# Patient Record
Sex: Female | Born: 1945 | Race: Black or African American | Hispanic: No | Marital: Single | Smoking: Current every day smoker
Health system: Southern US, Community
[De-identification: ages and names within clinical notes are randomized; demographics above are authoritative.]

## PROBLEM LIST (undated history)

## (undated) DIAGNOSIS — I639 Cerebral infarction, unspecified: Secondary | ICD-10-CM

## (undated) DIAGNOSIS — E119 Type 2 diabetes mellitus without complications: Secondary | ICD-10-CM

## (undated) DIAGNOSIS — I10 Essential (primary) hypertension: Secondary | ICD-10-CM

## (undated) DIAGNOSIS — F1410 Cocaine abuse, uncomplicated: Secondary | ICD-10-CM

## (undated) DIAGNOSIS — Z720 Tobacco use: Secondary | ICD-10-CM

## (undated) DIAGNOSIS — F1010 Alcohol abuse, uncomplicated: Secondary | ICD-10-CM

## (undated) HISTORY — PX: ABDOMINAL HYSTERECTOMY: SHX81

---

## 2017-10-02 ENCOUNTER — Encounter (HOSPITAL_COMMUNITY): Payer: Self-pay

## 2017-10-02 ENCOUNTER — Inpatient Hospital Stay (HOSPITAL_COMMUNITY): Payer: Medicare HMO

## 2017-10-02 ENCOUNTER — Emergency Department (HOSPITAL_COMMUNITY): Payer: Medicare HMO

## 2017-10-02 ENCOUNTER — Inpatient Hospital Stay (HOSPITAL_COMMUNITY)
Admission: EM | Admit: 2017-10-02 | Discharge: 2017-10-08 | DRG: 291 | Disposition: A | Discharge status: Skilled Nursing Facility | Payer: Medicare HMO | Attending: Oncology | Admitting: Oncology

## 2017-10-02 ENCOUNTER — Other Ambulatory Visit: Payer: Self-pay

## 2017-10-02 DIAGNOSIS — F39 Unspecified mood [affective] disorder: Secondary | ICD-10-CM | POA: Diagnosis not present

## 2017-10-02 DIAGNOSIS — Z79899 Other long term (current) drug therapy: Secondary | ICD-10-CM

## 2017-10-02 DIAGNOSIS — I5023 Acute on chronic systolic (congestive) heart failure: Secondary | ICD-10-CM | POA: Diagnosis present

## 2017-10-02 DIAGNOSIS — F1290 Cannabis use, unspecified, uncomplicated: Secondary | ICD-10-CM | POA: Diagnosis not present

## 2017-10-02 DIAGNOSIS — F319 Bipolar disorder, unspecified: Secondary | ICD-10-CM | POA: Diagnosis present

## 2017-10-02 DIAGNOSIS — Z9114 Patient's other noncompliance with medication regimen: Secondary | ICD-10-CM | POA: Diagnosis not present

## 2017-10-02 DIAGNOSIS — J9691 Respiratory failure, unspecified with hypoxia: Secondary | ICD-10-CM

## 2017-10-02 DIAGNOSIS — I509 Heart failure, unspecified: Secondary | ICD-10-CM

## 2017-10-02 DIAGNOSIS — R0600 Dyspnea, unspecified: Secondary | ICD-10-CM

## 2017-10-02 DIAGNOSIS — Z736 Limitation of activities due to disability: Secondary | ICD-10-CM | POA: Diagnosis not present

## 2017-10-02 DIAGNOSIS — F1210 Cannabis abuse, uncomplicated: Secondary | ICD-10-CM | POA: Diagnosis present

## 2017-10-02 DIAGNOSIS — F1410 Cocaine abuse, uncomplicated: Secondary | ICD-10-CM | POA: Diagnosis present

## 2017-10-02 DIAGNOSIS — I43 Cardiomyopathy in diseases classified elsewhere: Secondary | ICD-10-CM | POA: Diagnosis present

## 2017-10-02 DIAGNOSIS — E876 Hypokalemia: Secondary | ICD-10-CM | POA: Diagnosis not present

## 2017-10-02 DIAGNOSIS — F419 Anxiety disorder, unspecified: Secondary | ICD-10-CM

## 2017-10-02 DIAGNOSIS — Z91013 Allergy to seafood: Secondary | ICD-10-CM | POA: Diagnosis not present

## 2017-10-02 DIAGNOSIS — F1910 Other psychoactive substance abuse, uncomplicated: Secondary | ICD-10-CM

## 2017-10-02 DIAGNOSIS — I110 Hypertensive heart disease with heart failure: Secondary | ICD-10-CM | POA: Diagnosis not present

## 2017-10-02 DIAGNOSIS — R740 Nonspecific elevation of levels of transaminase and lactic acid dehydrogenase [LDH]: Secondary | ICD-10-CM | POA: Diagnosis present

## 2017-10-02 DIAGNOSIS — Z560 Unemployment, unspecified: Secondary | ICD-10-CM | POA: Diagnosis not present

## 2017-10-02 DIAGNOSIS — E1165 Type 2 diabetes mellitus with hyperglycemia: Secondary | ICD-10-CM

## 2017-10-02 DIAGNOSIS — R451 Restlessness and agitation: Secondary | ICD-10-CM | POA: Diagnosis not present

## 2017-10-02 DIAGNOSIS — Z8659 Personal history of other mental and behavioral disorders: Secondary | ICD-10-CM | POA: Diagnosis not present

## 2017-10-02 DIAGNOSIS — F1099 Alcohol use, unspecified with unspecified alcohol-induced disorder: Secondary | ICD-10-CM | POA: Diagnosis not present

## 2017-10-02 DIAGNOSIS — M6281 Muscle weakness (generalized): Secondary | ICD-10-CM | POA: Diagnosis not present

## 2017-10-02 DIAGNOSIS — E119 Type 2 diabetes mellitus without complications: Secondary | ICD-10-CM | POA: Diagnosis present

## 2017-10-02 DIAGNOSIS — N183 Chronic kidney disease, stage 3 (moderate): Secondary | ICD-10-CM | POA: Diagnosis present

## 2017-10-02 DIAGNOSIS — I130 Hypertensive heart and chronic kidney disease with heart failure and stage 1 through stage 4 chronic kidney disease, or unspecified chronic kidney disease: Secondary | ICD-10-CM | POA: Diagnosis present

## 2017-10-02 DIAGNOSIS — Z8673 Personal history of transient ischemic attack (TIA), and cerebral infarction without residual deficits: Secondary | ICD-10-CM

## 2017-10-02 DIAGNOSIS — F10239 Alcohol dependence with withdrawal, unspecified: Secondary | ICD-10-CM | POA: Diagnosis not present

## 2017-10-02 DIAGNOSIS — F1010 Alcohol abuse, uncomplicated: Secondary | ICD-10-CM

## 2017-10-02 DIAGNOSIS — Z23 Encounter for immunization: Secondary | ICD-10-CM

## 2017-10-02 DIAGNOSIS — F1490 Cocaine use, unspecified, uncomplicated: Secondary | ICD-10-CM | POA: Diagnosis not present

## 2017-10-02 DIAGNOSIS — F17210 Nicotine dependence, cigarettes, uncomplicated: Secondary | ICD-10-CM | POA: Diagnosis present

## 2017-10-02 DIAGNOSIS — I361 Nonrheumatic tricuspid (valve) insufficiency: Secondary | ICD-10-CM | POA: Diagnosis not present

## 2017-10-02 DIAGNOSIS — I878 Other specified disorders of veins: Secondary | ICD-10-CM | POA: Diagnosis not present

## 2017-10-02 DIAGNOSIS — E1122 Type 2 diabetes mellitus with diabetic chronic kidney disease: Secondary | ICD-10-CM | POA: Diagnosis present

## 2017-10-02 DIAGNOSIS — R450 Nervousness: Secondary | ICD-10-CM | POA: Diagnosis not present

## 2017-10-02 DIAGNOSIS — I160 Hypertensive urgency: Secondary | ICD-10-CM | POA: Diagnosis present

## 2017-10-02 DIAGNOSIS — IMO0002 Reserved for concepts with insufficient information to code with codable children: Secondary | ICD-10-CM

## 2017-10-02 DIAGNOSIS — I5021 Acute systolic (congestive) heart failure: Secondary | ICD-10-CM | POA: Diagnosis not present

## 2017-10-02 HISTORY — DX: Alcohol abuse, uncomplicated: F10.10

## 2017-10-02 HISTORY — DX: Cerebral infarction, unspecified: I63.9

## 2017-10-02 HISTORY — DX: Essential (primary) hypertension: I10

## 2017-10-02 HISTORY — DX: Tobacco use: Z72.0

## 2017-10-02 HISTORY — DX: Type 2 diabetes mellitus without complications: E11.9

## 2017-10-02 HISTORY — DX: Cocaine abuse, uncomplicated: F14.10

## 2017-10-02 LAB — COMPREHENSIVE METABOLIC PANEL
ALBUMIN: 3.5 g/dL (ref 3.5–5.0)
ALT: 40 U/L (ref 14–54)
ANION GAP: 9 (ref 5–15)
AST: 76 U/L — ABNORMAL HIGH (ref 15–41)
Alkaline Phosphatase: 91 U/L (ref 38–126)
BILIRUBIN TOTAL: 0.7 mg/dL (ref 0.3–1.2)
BUN: 22 mg/dL — ABNORMAL HIGH (ref 6–20)
CHLORIDE: 106 mmol/L (ref 101–111)
CO2: 23 mmol/L (ref 22–32)
Calcium: 8.3 mg/dL — ABNORMAL LOW (ref 8.9–10.3)
Creatinine, Ser: 1.37 mg/dL — ABNORMAL HIGH (ref 0.44–1.00)
GFR calc Af Amer: 43 mL/min — ABNORMAL LOW (ref >60)
GFR, EST NON AFRICAN AMERICAN: 37 mL/min — AB (ref >60)
GLUCOSE: 190 mg/dL — AB (ref 65–99)
POTASSIUM: 3.2 mmol/L — AB (ref 3.5–5.1)
Sodium: 138 mmol/L (ref 135–145)
TOTAL PROTEIN: 7.6 g/dL (ref 6.5–8.1)

## 2017-10-02 LAB — URINALYSIS, ROUTINE W REFLEX MICROSCOPIC
Bilirubin Urine: NEGATIVE
Glucose, UA: NEGATIVE mg/dL
Ketones, ur: NEGATIVE mg/dL
Nitrite: NEGATIVE
PROTEIN: 30 mg/dL — AB
Specific Gravity, Urine: 1.006 (ref 1.005–1.030)
pH: 5 (ref 5.0–8.0)

## 2017-10-02 LAB — MRSA PCR SCREENING: MRSA by PCR: NEGATIVE

## 2017-10-02 LAB — CBC WITH DIFFERENTIAL/PLATELET
BASOS PCT: 0 %
Basophils Absolute: 0 10*3/uL (ref 0.0–0.1)
EOS ABS: 0.1 10*3/uL (ref 0.0–0.7)
Eosinophils Relative: 1 %
HCT: 37.4 % (ref 36.0–46.0)
HEMOGLOBIN: 12.1 g/dL (ref 12.0–15.0)
Lymphocytes Relative: 22 %
Lymphs Abs: 2.3 10*3/uL (ref 0.7–4.0)
MCH: 31.3 pg (ref 26.0–34.0)
MCHC: 32.4 g/dL (ref 30.0–36.0)
MCV: 96.9 fL (ref 78.0–100.0)
Monocytes Absolute: 0.4 10*3/uL (ref 0.1–1.0)
Monocytes Relative: 4 %
NEUTROS ABS: 7.6 10*3/uL (ref 1.7–7.7)
NEUTROS PCT: 73 %
Platelets: 151 10*3/uL (ref 150–400)
RBC: 3.86 MIL/uL — AB (ref 3.87–5.11)
RDW: 14.1 % (ref 11.5–15.5)
WBC: 10.5 10*3/uL (ref 4.0–10.5)

## 2017-10-02 LAB — RAPID URINE DRUG SCREEN, HOSP PERFORMED
Amphetamines: NOT DETECTED
BENZODIAZEPINES: NOT DETECTED
Barbiturates: NOT DETECTED
COCAINE: NOT DETECTED
Opiates: POSITIVE — AB
Tetrahydrocannabinol: NOT DETECTED

## 2017-10-02 LAB — MAGNESIUM: Magnesium: 2 mg/dL (ref 1.7–2.4)

## 2017-10-02 LAB — PHOSPHORUS: Phosphorus: 4.5 mg/dL (ref 2.5–4.6)

## 2017-10-02 LAB — GLUCOSE, CAPILLARY
GLUCOSE-CAPILLARY: 125 mg/dL — AB (ref 65–99)
GLUCOSE-CAPILLARY: 145 mg/dL — AB (ref 65–99)

## 2017-10-02 LAB — ECHOCARDIOGRAM COMPLETE
HEIGHTINCHES: 61 in
WEIGHTICAEL: 2720 [oz_av]

## 2017-10-02 LAB — HEMOGLOBIN A1C
Hgb A1c MFr Bld: 4.9 % (ref 4.8–5.6)
MEAN PLASMA GLUCOSE: 93.93 mg/dL

## 2017-10-02 LAB — CBG MONITORING, ED: Glucose-Capillary: 308 mg/dL — ABNORMAL HIGH (ref 65–99)

## 2017-10-02 LAB — BRAIN NATRIURETIC PEPTIDE: B Natriuretic Peptide: 1730.2 pg/mL — ABNORMAL HIGH (ref 0.0–100.0)

## 2017-10-02 LAB — I-STAT TROPONIN, ED: TROPONIN I, POC: 0 ng/mL (ref 0.00–0.08)

## 2017-10-02 LAB — ETHANOL

## 2017-10-02 LAB — TSH: TSH: 0.976 u[IU]/mL (ref 0.350–4.500)

## 2017-10-02 MED ORDER — ASPIRIN EC 81 MG PO TBEC
81.0000 mg | DELAYED_RELEASE_TABLET | Freq: Every day | ORAL | Status: DC
  Administered 2017-10-02 – 2017-10-08 (×7): 81 mg via ORAL
  Filled 2017-10-02 (×7): qty 1

## 2017-10-02 MED ORDER — MORPHINE SULFATE (PF) 4 MG/ML IV SOLN
4.0000 mg | Freq: Once | INTRAVENOUS | Status: AC
  Administered 2017-10-02: 4 mg via INTRAVENOUS
  Filled 2017-10-02: qty 1

## 2017-10-02 MED ORDER — METHYLPREDNISOLONE SODIUM SUCC 125 MG IJ SOLR
125.0000 mg | Freq: Once | INTRAMUSCULAR | Status: AC
  Administered 2017-10-02: 125 mg via INTRAVENOUS
  Filled 2017-10-02: qty 2

## 2017-10-02 MED ORDER — SODIUM CHLORIDE 0.9% FLUSH: 3.0000 mL | INTRAVENOUS | Status: DC | PRN

## 2017-10-02 MED ORDER — FUROSEMIDE 10 MG/ML IJ SOLN
40.0000 mg | Freq: Every day | INTRAMUSCULAR | Status: DC
  Administered 2017-10-03 – 2017-10-04 (×2): 40 mg via INTRAVENOUS
  Filled 2017-10-02 (×2): qty 4

## 2017-10-02 MED ORDER — ADULT MULTIVITAMIN W/MINERALS CH
1.0000 | ORAL_TABLET | Freq: Every day | ORAL | Status: DC
  Administered 2017-10-02 – 2017-10-08 (×7): 1 via ORAL
  Filled 2017-10-02 (×7): qty 1

## 2017-10-02 MED ORDER — LORAZEPAM 2 MG/ML IJ SOLN
2.0000 mg | INTRAMUSCULAR | Status: DC | PRN
  Administered 2017-10-02 – 2017-10-04 (×4): 2 mg via INTRAVENOUS
  Filled 2017-10-02 (×4): qty 1

## 2017-10-02 MED ORDER — LORAZEPAM 2 MG/ML IJ SOLN
1.0000 mg | Freq: Once | INTRAMUSCULAR | Status: AC
  Administered 2017-10-02: 1 mg via INTRAVENOUS
  Filled 2017-10-02: qty 1

## 2017-10-02 MED ORDER — SODIUM CHLORIDE 0.9% FLUSH
3.0000 mL | Freq: Two times a day (BID) | INTRAVENOUS | Status: DC
  Administered 2017-10-02 – 2017-10-06 (×7): 3 mL via INTRAVENOUS

## 2017-10-02 MED ORDER — VITAMIN B-1 100 MG PO TABS
100.0000 mg | ORAL_TABLET | Freq: Every day | ORAL | Status: DC
  Administered 2017-10-02 – 2017-10-08 (×7): 100 mg via ORAL
  Filled 2017-10-02 (×7): qty 1

## 2017-10-02 MED ORDER — PNEUMOCOCCAL VAC POLYVALENT 25 MCG/0.5ML IJ INJ
0.5000 mL | INJECTION | INTRAMUSCULAR | Status: AC
  Administered 2017-10-04: 0.5 mL via INTRAMUSCULAR
  Filled 2017-10-02: qty 0.5

## 2017-10-02 MED ORDER — HYDRALAZINE HCL 20 MG/ML IJ SOLN
5.0000 mg | Freq: Four times a day (QID) | INTRAMUSCULAR | Status: DC | PRN
  Administered 2017-10-02 – 2017-10-03 (×4): 5 mg via INTRAVENOUS
  Filled 2017-10-02 (×4): qty 1

## 2017-10-02 MED ORDER — NICOTINE 14 MG/24HR TD PT24
14.0000 mg | MEDICATED_PATCH | Freq: Every day | TRANSDERMAL | Status: DC
  Administered 2017-10-02 – 2017-10-08 (×7): 14 mg via TRANSDERMAL
  Filled 2017-10-02 (×7): qty 1

## 2017-10-02 MED ORDER — INFLUENZA VAC SPLIT HIGH-DOSE 0.5 ML IM SUSY
0.5000 mL | PREFILLED_SYRINGE | INTRAMUSCULAR | Status: AC
  Administered 2017-10-05: 0.5 mL via INTRAMUSCULAR
  Filled 2017-10-02: qty 0.5

## 2017-10-02 MED ORDER — NITROGLYCERIN IN D5W 200-5 MCG/ML-% IV SOLN
0.0000 ug/min | Freq: Once | INTRAVENOUS | Status: AC
  Administered 2017-10-02: 5 ug/min via INTRAVENOUS
  Filled 2017-10-02: qty 250

## 2017-10-02 MED ORDER — ALBUTEROL SULFATE (2.5 MG/3ML) 0.083% IN NEBU
10.0000 mg | INHALATION_SOLUTION | Freq: Once | RESPIRATORY_TRACT | Status: AC
  Administered 2017-10-02: 10 mg via RESPIRATORY_TRACT
  Filled 2017-10-02: qty 12

## 2017-10-02 MED ORDER — ACETAMINOPHEN 325 MG PO TABS
650.0000 mg | ORAL_TABLET | Freq: Four times a day (QID) | ORAL | Status: DC | PRN
  Administered 2017-10-02 – 2017-10-03 (×2): 650 mg via ORAL
  Filled 2017-10-02 (×2): qty 2

## 2017-10-02 MED ORDER — ALPRAZOLAM 0.5 MG PO TABS
0.5000 mg | ORAL_TABLET | Freq: Two times a day (BID) | ORAL | Status: DC | PRN
  Administered 2017-10-02 – 2017-10-04 (×4): 0.5 mg via ORAL
  Filled 2017-10-02 (×4): qty 1

## 2017-10-02 MED ORDER — ACETAMINOPHEN 650 MG RE SUPP: 650.0000 mg | Freq: Four times a day (QID) | RECTAL | Status: DC | PRN

## 2017-10-02 MED ORDER — INSULIN ASPART 100 UNIT/ML ~~LOC~~ SOLN
0.0000 [IU] | Freq: Three times a day (TID) | SUBCUTANEOUS | Status: DC
  Administered 2017-10-02: 1 [IU] via SUBCUTANEOUS
  Administered 2017-10-02: 7 [IU] via SUBCUTANEOUS
  Administered 2017-10-04: 3 [IU] via SUBCUTANEOUS
  Administered 2017-10-05 – 2017-10-07 (×2): 2 [IU] via SUBCUTANEOUS
  Administered 2017-10-08: 1 [IU] via SUBCUTANEOUS
  Filled 2017-10-02: qty 1

## 2017-10-02 MED ORDER — HEPARIN SODIUM (PORCINE) 5000 UNIT/ML IJ SOLN
5000.0000 [IU] | Freq: Three times a day (TID) | INTRAMUSCULAR | Status: DC
  Administered 2017-10-02 – 2017-10-08 (×13): 5000 [IU] via SUBCUTANEOUS
  Filled 2017-10-02 (×13): qty 1

## 2017-10-02 MED ORDER — SODIUM CHLORIDE 0.9 % IV SOLN: 250.0000 mL | INTRAVENOUS | Status: DC | PRN

## 2017-10-02 MED ORDER — ALBUTEROL SULFATE (2.5 MG/3ML) 0.083% IN NEBU: 2.5000 mg | INHALATION_SOLUTION | RESPIRATORY_TRACT | Status: DC | PRN

## 2017-10-02 MED ORDER — FUROSEMIDE 10 MG/ML IJ SOLN
40.0000 mg | Freq: Once | INTRAMUSCULAR | Status: AC
  Administered 2017-10-02: 40 mg via INTRAVENOUS
  Filled 2017-10-02: qty 4

## 2017-10-02 MED ORDER — FOLIC ACID 5 MG/ML IJ SOLN
1.0000 mg | Freq: Every day | INTRAMUSCULAR | Status: DC
  Administered 2017-10-02 – 2017-10-03 (×2): 1 mg via INTRAVENOUS
  Filled 2017-10-02 (×2): qty 0.2

## 2017-10-02 MED ORDER — POTASSIUM CHLORIDE CRYS ER 20 MEQ PO TBCR
40.0000 meq | EXTENDED_RELEASE_TABLET | Freq: Once | ORAL | Status: AC
  Administered 2017-10-02: 40 meq via ORAL
  Filled 2017-10-02: qty 2

## 2017-10-02 MED ORDER — SODIUM CHLORIDE 0.9% FLUSH
3.0000 mL | Freq: Two times a day (BID) | INTRAVENOUS | Status: DC
  Administered 2017-10-02 – 2017-10-07 (×9): 3 mL via INTRAVENOUS

## 2017-10-02 NOTE — ED Provider Notes (Signed)
[facility] EMERGENCY DEPARTMENT Provider Note   CSN: [id number] Arrival date & time: 10/02/17  0345     History   Chief Complaint Chief Complaint  Patient presents with  . Shortness of Breath  . Altered Mental Status    HPI Zandria Woldt is a 72 y.o. female.  Patient is a 72 year old female with past medical history of COPD.  She was brought by EMS for evaluation of shortness of breath.  Apparently she woke this morning with difficulty breathing.  She called EMS and was transported here.  She has been extremely anxious and tachypneic during transport.  Her oxygen saturations were in the 80s.  She is somewhat difficult to get a history from, however she denies any chest pain, recent fever, or productive cough.   The history is provided by the patient and the EMS personnel.  Shortness of Breath  This is a new problem. The average episode lasts 1 hour. The problem occurs continuously.The problem has not changed since onset.Pertinent negatives include no fever, no cough, no sputum production and no chest pain.  Altered Mental Status      No past medical history on file.  There are no active problems to display for this patient.    OB History    No data available       Home Medications    Prior to Admission medications   Not on File    Family History No family history on file.  Social History Social History   Tobacco Use  . Smoking status: Not on file  Substance Use Topics  . Alcohol use: Not on file  . Drug use: Not on file     Allergies   Patient has no allergy information on record.   Review of Systems Review of Systems  Constitutional: Negative for fever.  Respiratory: Positive for shortness of breath. Negative for cough and sputum production.   Cardiovascular: Negative for chest pain.  All other systems reviewed and are negative.    Physical Exam Updated Vital Signs BP (!) 189/129 (BP Location: Right Arm)   Pulse (!) 118    Temp (!) 96.2 F (35.7 C) (Temporal)   Resp (!) 35   SpO2 (!) 82%   Physical Exam  Constitutional: She is oriented to person, place, and time. She appears well-developed and well-nourished. No distress.  Patient is very anxious and hyperventilating.  HENT:  Head: Normocephalic and atraumatic.  Neck: Normal range of motion. Neck supple.  Cardiovascular: Normal rate and regular rhythm. Exam reveals no gallop and no friction rub.  No murmur heard. Pulmonary/Chest: Effort normal and breath sounds normal. No respiratory distress. She has no wheezes. She has no rales.  Patient with expiratory wheezes bilaterally.  Abdominal: Soft. Bowel sounds are normal. She exhibits no distension. There is no tenderness.  Musculoskeletal: Normal range of motion.       Right lower leg: She exhibits no tenderness and no edema.       Left lower leg: She exhibits no tenderness and no edema.  Neurological: She is alert and oriented to person, place, and time.  Skin: Skin is warm and dry. She is not diaphoretic.  Nursing note and vitals reviewed.    ED Treatments / Results  Labs (all labs ordered are listed, but only abnormal results are displayed) Labs Reviewed  COMPREHENSIVE METABOLIC PANEL  ETHANOL  BRAIN NATRIURETIC PEPTIDE  CBC WITH DIFFERENTIAL/PLATELET  I-STAT TROPONIN, ED    EKG  EKG Interpretation None  Radiology No results found.  Procedures Procedures (including critical care time)  Medications Ordered in ED Medications  albuterol (PROVENTIL) (2.5 MG/3ML) 0.083% nebulizer solution 10 mg (not administered)  methylPREDNISolone sodium succinate (SOLU-MEDROL) 125 mg/2 mL injection 125 mg (not administered)     Initial Impression / Assessment and Plan / ED Course  I have reviewed the triage vital signs and the nursing notes.  Pertinent labs & imaging results that were available during my care of the patient were reviewed by me and considered in my medical decision making  (see chart for details).  Patient presents here with complaints of difficulty breathing.  This woke her from sleep.  Her chest x-ray is consistent with congestive heart failure and BNP is 1700.  She has no history of this.  Is also markedly hypertensive and hypoxic.  She required nitroglycerin intravenously and supplemental oxygen.  Her blood pressure has improved somewhat and she is feeling somewhat better.  As she has no history of CHF, I feel as though admission for further workup is indicated.  I have spoken with the internal medicine teaching service who agrees to admit.  CRITICAL CARE Performed by: Geoffery Lyons Total critical care time: 45 minutes Critical care time was exclusive of separately billable procedures and treating other patients. Critical care was necessary to treat or prevent imminent or life-threatening deterioration. Critical care was time spent personally by me on the following activities: development of treatment plan with patient and/or surrogate as well as nursing, discussions with consultants, evaluation of patient's response to treatment, examination of patient, obtaining history from patient or surrogate, ordering and performing treatments and interventions, ordering and review of laboratory studies, ordering and review of radiographic studies, pulse oximetry and re-evaluation of patient's condition.   Final Clinical Impressions(s) / ED Diagnoses   Final diagnoses:  None    ED Discharge Orders    None       Geoffery Lyons, MD 10/02/17 [phone]

## 2017-10-02 NOTE — ED Notes (Signed)
[phone] [name] Chuiso Pt daughter

## 2017-10-02 NOTE — ED Notes (Signed)
Pt yelling out, found at end of bed grabbing legs asking for help with leg pain, stated "does alcohol help with the pain?!". Pt informed alcohol would not help and asked to sit back in bed. Pt repositioned and nasal cannula replaced. Primary nurse aware.

## 2017-10-02 NOTE — ED Triage Notes (Signed)
Pt BIB [facility] for eval of SHOB. Pt coming from home for SOB, pt arrives extremely anxious, tachypneic and  Hypertensive. EMS reports pt +ETOH- vodka drinks.

## 2017-10-02 NOTE — ED Notes (Signed)
Admitting MD at bedside.

## 2017-10-02 NOTE — H&P (Addendum)
Date: 10/02/2017               Patient Name:  Taylor Carrillo MRN: [id number]  DOB: 10/07/1945 Age / Sex: 72 y.o., female   PCP: System, Pcp Not In         Medical Service: Internal Medicine Teaching Service         Attending Physician: Dr. Beryle Beams, Alyson Locket, MD    First Contact: Dr. Aggie Hacker Pager: [phone]  Second Contact: Dr. Danford Bad Pager: [phone]       After Hours (After 5p/  First Contact Pager: [phone]  weekends / holidays): Second Contact Pager: [phone]   Chief Complaint: Shortness of breath  History of Present Illness:  Taylor Carrillo is a 72 year old female with a past medical history significant for type 2 diabetes mellitus, hypertension, prior CVA, anxiety, cocaine/tobacco/marijuana/alcohol abuse who presented to the emergency department this morning with complaints of shortness of breath.  She reports that she was awakened from sleep this morning with difficulty breathing and called EMS.  She reports that she has had ongoing shortness of breath for the past 1-2 months.  She says that she has had stable 2 pillow orthopnea for the past several months, however, in the last 1-2 months she has had no new shortness of breath while lying flat.  Additionally, she notes that she has had several episodes where she will wake up at night gasping for air as she had this morning.  She denies any dyspnea with exertion, lower extremity swelling, cough, chest pain, palpitations, fever.  He does report a history of active cocaine abuse and cocaine as often as she is able to obtain it, usually weekly.  She says that last used cocaine about a month ago.  She reports that sometime last year she discontinued all of her blood pressure and diabetic medications as she felt it was not helping her.  Shortly, she notes a recent hospitalization at an outside hospital after having a possible syncopal episode.  She is unable to recall the name of this hospital or what her final diagnosis from that hospitalization  although she notes it may have been related to a prior stroke.  In the ED she was noted to be afebrile, tachycardic (100-118), tachypneic (35), significantly hypertensive with a blood pressure of 237/142, and hypoxic to 82% on room air that improved when placed on 6 L of oxygen.  She was noted to be very anxious and hyper ventilating with expiratory wheezes bilaterally by the ED provider.  Her chest x-ray in the emergency room was notable for cardiomegaly with pulmonary edema and bilateral pleural effusions.  Her BNP returned at 1700.  EKG was only notable for sinus tachycardia with LVH and initial troponin was 0.00.  Received an albuterol nebulizer treatment, Solu-Medrol, Lasix, nitroglycerin drip in the ED.  Her respiratory status improved significantly after treatment.  Internal medicine teaching service was called to admit for further workup and evaluation.  Meds:  Current Meds  Medication Sig  . ALPRAZolam (XANAX) 0.5 MG tablet Take 0.5 mg by mouth 2 (two) times daily as needed for anxiety.     Allergies: Allergies as of 10/02/2017 - Review Complete 10/02/2017  Allergen Reaction Noted  . Shrimp [shellfish allergy] Swelling 10/02/2017   Past Medical History:  Diagnosis Date  . Alcohol abuse   . Cocaine abuse ([place])   . CVA (cerebral vascular accident) ([place])   . Diabetes mellitus without complication ([place])   . Hypertension   . Tobacco  abuse    Past Surgical History:  Procedure Laterality Date  . ABDOMINAL HYSTERECTOMY     Family History: Family History  Problem Relation Age of Onset  . Heart failure Sister   . Diabetes Daughter    Social History:  Social History   Tobacco Use  . Smoking status: Current Every Day Smoker    Packs/day: 2.00    Years: 50.00    Pack years: 100.00    Types: Cigarettes  . Smokeless tobacco: Never Used  Substance Use Topics  . Alcohol use: Yes    Alcohol/week: 8.4 oz    Types: 14 Standard drinks or equivalent per week    Comment: 2-3 shots  of liquor daily  . Drug use: Yes    Types: Marijuana, Cocaine    Comment: TODAY    Review of Systems: A complete ROS was negative except as per HPI.   Physical Exam: Blood pressure (!) 182/108, pulse 90, temperature (!) 96.2 F (35.7 C), temperature source Temporal, resp. rate 19, height '5\' 1"'  (1.549 m), weight 170 lb (77.1 kg), SpO2 95 %. General: alert, well-developed, and cooperative to examination.  Head: normocephalic and atraumatic.  Eyes: vision grossly intact, pupils equal, pupils round, pupils reactive to light, no injection and anicteric.  Mouth: pharynx pink and moist, no erythema, and no exudates.  Neck: supple, full ROM, no thyromegaly, no JVD, positive hepatojugular reflux, and no carotid bruits.  Lungs: normal respiratory effort, no accessory muscle use, diminished breath sounds at bilateral lung bases, mild expiratory wheezing diffusely.  No crackles appreciated. Heart: Tachycardic, regular rhythm, no murmur, no gallop, and no rub.  Abdomen: soft, non-tender, normal bowel sounds, no distention, no guarding Msk: no joint swelling, no joint warmth, and no redness over joints.  Pulses: 1+ DP/PT pulses bilaterally Extremities: No cyanosis, clubbing, edema Neurologic: alert & oriented X3, cranial nerves II-XII intact, strength normal in all extremities, sensation intact to light touch Skin: turgor normal and no rashes.  Psych: Normal mood and affect   EKG: personally reviewed my interpretation is sinus tachycardia with LVH, no priors to compare  CXR: personally reviewed my interpretation is cardiomegaly with pulmonary edema and bilateral pleural effusions  Assessment & Plan by Problem:  Hypoxic respiratory failure with hypertensive urgency and signs of volume overload: Patient's initial blood pressure on arrival was 237/145 with respiratory distress and hypoxia.  No evidence of endorgan damage on blood work with negative troponin and reassuring CMP only notable for  mildly elevated creatinine though unclear baseline.  She was initially treated with nitroglycerin drip in the ED with good response and improvement in symptoms.  Work did reveal elevated BNP of 1700.  Chest x-ray did demonstrate bilateral pulmonary effusions.  EKG was remarkable for sinus tachycardia and LVH.  Patient reports a history of hypertension however has self discontinued her medications for unknown duration.  Additionally, she has a history of cocaine abuse reports most recent use of approximately 1 month ago.  She reports ongoing two-pillow orthopnea and recent PND for the past 1-2 months.  Overall, her picture is most consistent with uncontrolled hypertension with possible underlying heart failure.  Discontinue her nitroglycerin drip and use hydralazine as needed to maintain systolic blood pressure less than 180.  Will initiate oral antihypertensives once we have a more clear picture possible heart failure and her renal function.  Will obtain echocardiogram to assess for possible underlying heart failure.  She received a dose of IV Lasix 40 mg in the ED.  Will  obtain daily weights and monitor intake and output and continue at this daily dose of Lasix and assess response.  Given her new possible heart failure and ongoing cocaine abuse we will avoid beta-blockers at this time.  Repeat BMP in the morning.  Diabetes mellitus type 2: Patient reports a history of diabetes.  She notes that she was previously on medications including Jardiance however self discontinued sometime last year.  Does not appear to have ever been on insulin.  Blood glucose on initial presentation today is 190.  Will check hemoglobin A1c and start sliding scale insulin sensitive.  AKI versus CKD stage IIIb: Creatinine 1.37 with a GFR 43 on presentation.  No baseline labs to compare.  Given her apparent long-standing uncontrolled hypertension, diabetes, and ongoing cocaine abuse she most likely has some degree of chronic kidney  disease.  Will obtain UA today and repeat BMP in the morning.  Hypokalemia: Potassium 3.2 on arrival.  Will replace with K-Dur 40 mEq today.  Check mag and phosphorus levels.  Elevated transaminases: AST 76 with ALT 40.  Normal alk phos and bilirubin.  Given her long-standing alcohol abuse this most likely well represents sedation secondary to alcohol.  Will monitor.  History of prior CVA: She reports a remote history of CVA though has no long-standing deficits.  Will start her on aspirin today.  Check fasting lipid panel in the morning and A1c.  Anxiety: She reports taking Xanax twice a day for anxiety.  Review of the [facility] does show Xanax 0.5 mg #54 filled monthly.  Will continue Xanax 0.5 mg twice daily as needed.  Cocaine and marijuana abuse: Check UDS  Alcohol abuse: Reports 2-3 shots of liquor daily.  Denies any history of alcohol withdrawal or DTs.  Does note remote history of possible seizure though is unclear if related to alcohol withdrawal.  Will start CIWA protocol.  Thiamine, folate, multivitamin daily.  Tobacco abuse: Reports smoking 2 cigarettes a day though long roughly 100-pack-year history of smoking.  Provide cessation education.  DVT PPx: Heparin SQ  Dispo: Admit patient to Inpatient with expected length of stay greater than 2 midnights.  Signed: Maryellen Pile, MD 10/02/2017, 8:56 AM  Pager: [phone]

## 2017-10-02 NOTE — Progress Notes (Signed)
  Echocardiogram 2D Echocardiogram has been performed.  Taylor Carrillo [name] 10/02/2017, 2:48 PM

## 2017-10-03 DIAGNOSIS — F10239 Alcohol dependence with withdrawal, unspecified: Secondary | ICD-10-CM

## 2017-10-03 DIAGNOSIS — F1490 Cocaine use, unspecified, uncomplicated: Secondary | ICD-10-CM

## 2017-10-03 DIAGNOSIS — Z9114 Patient's other noncompliance with medication regimen: Secondary | ICD-10-CM

## 2017-10-03 DIAGNOSIS — F419 Anxiety disorder, unspecified: Secondary | ICD-10-CM

## 2017-10-03 DIAGNOSIS — Z8659 Personal history of other mental and behavioral disorders: Secondary | ICD-10-CM

## 2017-10-03 DIAGNOSIS — Z79899 Other long term (current) drug therapy: Secondary | ICD-10-CM

## 2017-10-03 DIAGNOSIS — I110 Hypertensive heart disease with heart failure: Secondary | ICD-10-CM

## 2017-10-03 DIAGNOSIS — I5021 Acute systolic (congestive) heart failure: Secondary | ICD-10-CM

## 2017-10-03 DIAGNOSIS — I43 Cardiomyopathy in diseases classified elsewhere: Secondary | ICD-10-CM

## 2017-10-03 LAB — TROPONIN I
TROPONIN I: 0.03 ng/mL — AB (ref <0.03)
TROPONIN I: 0.04 ng/mL — AB (ref <0.03)
Troponin I: 0.03 ng/mL (ref <0.03)

## 2017-10-03 LAB — GLUCOSE, CAPILLARY
GLUCOSE-CAPILLARY: 94 mg/dL (ref 65–99)
Glucose-Capillary: 105 mg/dL — ABNORMAL HIGH (ref 65–99)
Glucose-Capillary: 102 mg/dL — ABNORMAL HIGH (ref 65–99)
Glucose-Capillary: 94 mg/dL (ref 65–99)

## 2017-10-03 LAB — LIPID PANEL
CHOL/HDL RATIO: 3.1 ratio
Cholesterol: 245 mg/dL — ABNORMAL HIGH (ref 0–200)
HDL: 79 mg/dL (ref >40)
LDL Cholesterol: 152 mg/dL — ABNORMAL HIGH (ref 0–99)
Triglycerides: 71 mg/dL (ref <150)
VLDL: 14 mg/dL (ref 0–40)

## 2017-10-03 LAB — BASIC METABOLIC PANEL
Anion gap: 10 (ref 5–15)
BUN: 27 mg/dL — AB (ref 6–20)
CHLORIDE: 105 mmol/L (ref 101–111)
CO2: 22 mmol/L (ref 22–32)
CREATININE: 1.26 mg/dL — AB (ref 0.44–1.00)
Calcium: 8.7 mg/dL — ABNORMAL LOW (ref 8.9–10.3)
GFR calc Af Amer: 48 mL/min — ABNORMAL LOW (ref >60)
GFR calc non Af Amer: 41 mL/min — ABNORMAL LOW (ref >60)
GLUCOSE: 114 mg/dL — AB (ref 65–99)
POTASSIUM: 4.2 mmol/L (ref 3.5–5.1)
Sodium: 137 mmol/L (ref 135–145)

## 2017-10-03 MED ORDER — LISINOPRIL 5 MG PO TABS
5.0000 mg | ORAL_TABLET | Freq: Every day | ORAL | Status: DC
  Administered 2017-10-03 – 2017-10-04 (×2): 5 mg via ORAL
  Filled 2017-10-03 (×2): qty 1

## 2017-10-03 MED ORDER — FOLIC ACID 1 MG PO TABS
1.0000 mg | ORAL_TABLET | Freq: Every day | ORAL | Status: DC
  Administered 2017-10-04 – 2017-10-08 (×5): 1 mg via ORAL
  Filled 2017-10-03 (×5): qty 1

## 2017-10-03 NOTE — Progress Notes (Signed)
   Subjective:  Patient seen and examined. No acute events overnight. She is accompanied by her daughter this AM. States her breathing has improved and has only mild shortness of breath occasionally. May be experiencing withdrawal symptoms. States she felt like a bug was crawling in her ear last night and she has been hearing things that are not there since she has been hospitalized Discussed her diagnosis of heart failure with the patient and her daughter.   Per patient's daughter she has a long history of crack cocaine and substance use. The daughter recently moved her from [place] to attempt to remove her from the environment where she was able to obtain drugs easily. The daughter states she is overwhelmed and needs help. Daughter says she the patient has a history of bipolar disorder, and was seeing a psychiatrist in [place].    Objective:  Vital signs in last 24 hours: Vitals:   10/02/17 2208 10/03/17 0005 10/03/17 0500 10/03/17 0753  BP: (!) 181/109 (!) 161/106 (!) 176/101 (!) 184/106  Pulse: 79 80 82 88  Resp:  (!) 22 20   Temp:  97.6 F (36.4 C) 98.4 F (36.9 C) 98.2 F (36.8 C)  TempSrc:  Axillary Axillary Oral  SpO2: 97% 98% 99% 99%  Weight:   154 lb 8.7 oz (70.1 kg)   Height:       General: Laying in bed comfortably, NAD HEENT: [place]/AT, EOMI, no scleral icterus, PERRL Cardiac: RRR, No R/M/G appreciated Pulm: normal effort, decreased breath sounds at the bases Abd: soft, non tender, non distended, BS normal Ext: extremities well perfused, no peripheral edema Neuro: alert and oriented X3, cranial nerves II-XII grossly intact   Assessment/Plan:  Principal Problem:   Respiratory failure with hypoxia (HCC) Active Problems:   Hypertensive urgency   Dyspnea   Drug abuse (HCC)   Alcohol abuse   Diabetes type 2, uncontrolled (HCC)   Anxiety  Acute Systolic Heart Failure Exacerbation Patient presented with acute onset dyspnea, hypoxia, BNP of 1730, and CXR consistent  with pulmonary edema. ECHO with moderate concentric hypertrophy; systolic function was moderately reduced; estimated ejection fraction was in the range of 35% -40%. Moderate diffuse hypokinesis with no identifiable regional variations. Euvolemic on examination, no JVD or peripheral edema. Lung exam with decreased breath sounds at bases bilaterally, but improved; saturating 99% on RA. Weight on admission 155.  -Will continue IV lasix 40 mg daily, will monitor creatine and lytes  -Strict I/Os -Daily weights -BMET in AM  Hypertension Patient has been non-adherent with blood pressure medications for quite sometime. Blood pressures on arrival to ED up to 230 systolic. Have improved 170-180 systolic, 90-100 diastolic. Creatinine 1.26 today from 1.37. No baseline available.  -Continue to monitor vitals -Continue Hydral 5 mg q6 PRN for SBP >180   Anxiety, ?Bipolar Disorder Polysubstance Use Currently on CIWA with ativan. Required two doses of ativan yesterday, CIWA scores 15,10.    -Continue 0.5 mg of ativan BID PRN -Will continue CIWA with ativan -Social work consulted    Dispo: Anticipated discharge in approximately 1-2 day(s).   Toney Rakes, MD 10/03/2017, 9:49 AM Pager: [phone]
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
## 2017-10-04 DIAGNOSIS — I878 Other specified disorders of veins: Secondary | ICD-10-CM

## 2017-10-04 DIAGNOSIS — R410 Disorientation, unspecified: Secondary | ICD-10-CM

## 2017-10-04 LAB — BASIC METABOLIC PANEL
ANION GAP: 10 (ref 5–15)
BUN: 28 mg/dL — AB (ref 6–20)
CALCIUM: 8.3 mg/dL — AB (ref 8.9–10.3)
CO2: 22 mmol/L (ref 22–32)
Chloride: 107 mmol/L (ref 101–111)
Creatinine, Ser: 1.3 mg/dL — ABNORMAL HIGH (ref 0.44–1.00)
GFR calc Af Amer: 46 mL/min — ABNORMAL LOW (ref >60)
GFR, EST NON AFRICAN AMERICAN: 40 mL/min — AB (ref >60)
GLUCOSE: 84 mg/dL (ref 65–99)
Potassium: 3.7 mmol/L (ref 3.5–5.1)
Sodium: 139 mmol/L (ref 135–145)

## 2017-10-04 LAB — GLUCOSE, CAPILLARY
GLUCOSE-CAPILLARY: 111 mg/dL — AB (ref 65–99)
Glucose-Capillary: 80 mg/dL (ref 65–99)
Glucose-Capillary: 219 mg/dL — ABNORMAL HIGH (ref 65–99)
Glucose-Capillary: 52 mg/dL — ABNORMAL LOW (ref 65–99)

## 2017-10-04 MED ORDER — LORAZEPAM 2 MG/ML IJ SOLN
0.0000 mg | INTRAMUSCULAR | Status: DC | PRN
  Administered 2017-10-05 (×3): 2 mg via INTRAVENOUS
  Filled 2017-10-04 (×4): qty 1

## 2017-10-04 MED ORDER — FUROSEMIDE 20 MG PO TABS
20.0000 mg | ORAL_TABLET | Freq: Every day | ORAL | Status: DC
  Administered 2017-10-05 – 2017-10-08 (×4): 20 mg via ORAL
  Filled 2017-10-04 (×4): qty 1

## 2017-10-04 MED ORDER — LORAZEPAM 2 MG/ML IJ SOLN
1.0000 mg | Freq: Four times a day (QID) | INTRAMUSCULAR | Status: DC | PRN
  Administered 2017-10-04: 1 mg via INTRAVENOUS
  Filled 2017-10-04: qty 1

## 2017-10-04 MED ORDER — HALOPERIDOL LACTATE 5 MG/ML IJ SOLN: 2.0000 mg | Freq: Once | INTRAMUSCULAR | Status: DC

## 2017-10-04 MED ORDER — HALOPERIDOL LACTATE 5 MG/ML IJ SOLN: 5.0000 mg | Freq: Once | INTRAMUSCULAR | Status: DC

## 2017-10-04 MED ORDER — SPIRONOLACTONE 12.5 MG HALF TABLET
12.5000 mg | ORAL_TABLET | Freq: Every day | ORAL | Status: DC
  Administered 2017-10-05 – 2017-10-08 (×4): 12.5 mg via ORAL
  Filled 2017-10-04 (×5): qty 1

## 2017-10-04 MED ORDER — DEXTROSE 50 % IV SOLN
INTRAVENOUS | Status: AC
  Administered 2017-10-04: 25 mL
  Filled 2017-10-04: qty 50

## 2017-10-04 MED ORDER — HALOPERIDOL LACTATE 5 MG/ML IJ SOLN
2.0000 mg | Freq: Once | INTRAMUSCULAR | Status: AC
  Administered 2017-10-04: 2 mg via INTRAVENOUS

## 2017-10-04 MED ORDER — LORAZEPAM 1 MG PO TABS: 1.0000 mg | ORAL_TABLET | Freq: Four times a day (QID) | ORAL | Status: DC | PRN

## 2017-10-04 MED ORDER — DEXTROSE 50 % IV SOLN: 25.0000 mL | Freq: Once | INTRAVENOUS | Status: AC

## 2017-10-04 MED ORDER — HALOPERIDOL LACTATE 5 MG/ML IJ SOLN
INTRAMUSCULAR | Status: AC
  Filled 2017-10-04: qty 1

## 2017-10-04 NOTE — Progress Notes (Signed)
Called MD LaCroce, post 2mg  Haldol pt continues to swing at staff, have auditory and visual hallucinations.  2 staff remain at bedside to prevent patient from injuring self trying to move about the room.  New orders for additional Haldol and states will obtain CCM consult for ICU transfer.

## 2017-10-04 NOTE — Progress Notes (Signed)
Follow up CBG 111, pt sleeping at present, arousable, but falls back to sleep quickly, continues to wake periodically with confusion wanting to get out of bed, sitter at bedside.  Raymon Mutton RN

## 2017-10-04 NOTE — Progress Notes (Signed)

## 2017-10-04 NOTE — Clinical Social Work Note (Signed)
Clinical Social Work Assessment  Patient Details  Name: Taylor Carrillo MRN: [id number] Date of Birth: [birth year]  Date of referral:  10/04/17               Reason for consult:  Facility Placement, Mental Health Concerns, Discharge Planning                Permission sought to share information with:  Facility Medical sales representative, Family Supports Permission granted to share information::  No(patient disoriented)  Name::     Taylor Carrillo  Agency::  SNFs  Relationship::  daughter  Contact Information:  [phone]  Housing/Transportation Living arrangements for the past 2 months:  Single Family Home Source of Information:  Adult Children Patient Interpreter Needed:  None Criminal Activity/Legal Involvement Pertinent to Current Situation/Hospitalization:  No - Comment as needed Significant Relationships:  Adult Children Lives with:  Adult Children Do you feel safe going back to the place where you live?  Yes Need for family participation in patient care:  Yes (Comment)  Care giving concerns: Patient from home with daughter. PT recommending SNF. Psychiatry consult is pending.  Social Worker assessment / plan: CSW called patient's daughter, [name], as patient is disoriented and has become agitated. CSW discussed disposition planning with daughter; at time of assessment, plan is for SNF. Daughter agreeable to SNF. CSW has faxed SNF referrals out, however, CSW explained to daughter that patient has not received any bed offers due to substance use. Daughter markedly upset by this and indicated patient cannot return home in this state; patient is unable to manage independently at home and daughter has to work. Patient and daughter unable to pay for care in the home.   Daughter indicated that patient does not use alcohol and insisted that this is documented incorrectly in her chart. CSW advised that according to patient's chart, patient is in active ETOH withdrawal. Daughter insisted patient may  be in withdrawal from other substances but does not use alcohol. Daughter stated that she moved patient up here to get away from where patient was more easily able to access drugs and that patient has not used cocaine in a month.   CSW to continue SNF bed search for patient. CSW advised daughter that bed search will likely have to be extended out of [facility]. CSW also noted patient's increasing agitation and pending psychiatry consult. CSW to follow for psych recommendations and will support with disposition planning.  Employment status:  Retired Health and safety inspector:  Managed Medicare([employer]) PT Recommendations:  Skilled Nursing Facility Information / Referral to community resources:  Skilled Nursing Facility  Patient/Family's Response to care: Patient's daughter appreciative of care in hospital.  Patient/Family's Understanding of and Emotional Response to Diagnosis, Current Treatment, and Prognosis: Daughter with understanding of patient's medical condition, though insisting on discrepancy in reports of patient's substance use. Daughter very hopeful for SNF placement and markedly upset at the prospect of patient returning home directly from hospital.  Emotional Assessment Appearance:  Appears stated age Attitude/Demeanor/Rapport:  Unable to Assess Affect (typically observed):  Unable to Assess Orientation:  Oriented to Self Alcohol / Substance use:  Alcohol Use, Illicit Drugs Psych involvement (Current and /or in the community):  Yes (Comment)  Discharge Needs  Concerns to be addressed:  Decision making concerns, Cognitive Concerns, Discharge Planning Concerns Readmission within the last 30 days:  No Current discharge risk:  Substance Abuse, Physical Impairment, Cognitively Impaired Barriers to Discharge:  Continued Medical Work up, Requiring sitter/restraints, No SNF bed, Awaiting State Approval (  [place])   Abigail Butts, LCSW 10/04/2017, 4:01 PM
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
## 2017-10-04 NOTE — Progress Notes (Signed)
Paged by nursing that patient was increasingly agitated and trying to leave. Went to bedside to evaluate. She is alert and oriented to person, not place or time. She believes the nurses are going to drive her home and does not have an understanding of why she is in the hospital. She also does not seem to understand why it is unsafe for her to leave. It is difficult to say if her symptoms are ETOH withdrawal (ETOH <10 on admission), delirium, or underlying psychiatric disorder. Could also be a combination of all. I do not feel that she has capacity to make informed decisions about her health.   Plan: -Re-ordered Ativan for CIWA protocol  -Will call patient's psychiatrist for details of her psychiatric history -May benefit from inpatient psychiatric consult  Juluis Mire, MD Internal Medicine PGY1 Pager # [phone]

## 2017-10-04 NOTE — Progress Notes (Signed)
Medicine attending: I examined this patient today together with resident physician Dr. Landis Martins and I concur with her evaluation and management plan. Patient presenting with new onset congestive heart failure secondary to congestive cardiomyopathy with reduced systolic function estimated ejection fraction 30-35%. Additional history of long-standing substance abuse.  Recently just alcohol.  Last cocaine use about 1 month ago. She is diuresing well.  She is in no respiratory distress.  Diffuse decreased breath sounds over the lung fields.  I was not able to appreciate any rales today.  Minimal JVD.  Regular cardiac rhythm no murmur.  No peripheral edema. We are working on blood pressure control and will transition to oral medications. Social work team actively assisting with discharge planning. Creatinine 1.3 and suspect this is her likely baseline.

## 2017-10-04 NOTE — Progress Notes (Addendum)
   Subjective:  Patient seen and examined. No acute events overnight. Denies shortness of breath. Less agitated this morning than yesterday. No acute complaints.    Objective:  Vital signs in last 24 hours: Vitals:   10/04/17 0400 10/04/17 0455 10/04/17 0500 10/04/17 0731  BP:  (!) 169/105 (!) 167/97 (!) 170/107  Pulse: 76 77 75 74  Resp: (!) 29  16 (!) 24  Temp:    (!) 97.5 F (36.4 C)  TempSrc:    Oral  SpO2: 95%  99% 97%  Weight:   152 lb 16 oz (69.4 kg)   Height:       General: Laying in bed comfortably, NAD HEENT: [place]/AT, EOMI, no scleral icterus, PERRL Cardiac: RRR, No R/M/G appreciated Pulm: normal effort, CTAB Abd: soft, non tender, non distended, BS normal Ext: extremities well perfused, no peripheral edema Neuro: alert and oriented X3, cranial nerves II-XII grossly intact   Assessment/Plan:  Principal Problem:   Respiratory failure with hypoxia (HCC) Active Problems:   Hypertensive urgency   Dyspnea   Drug abuse (HCC)   Alcohol abuse   Diabetes type 2, uncontrolled (HCC)   Anxiety  Acute Systolic Heart Failure Exacerbation Patient presented with acute onset dyspnea, hypoxia, BNP of 1730, and CXR consistent with pulmonary edema. ECHO with moderate concentric hypertrophy; systolic function was moderately reduced; estimated ejection fraction was in the range of 35% -40%. Moderate diffuse hypokinesis with no identifiable regional variations. Euvolemic on examination, no JVD or peripheral edema. Lung exam improved, no crackles or rales auscultated saturating 99% on RA. Weight on admission 155>>153, total output 1.5 L. Creatinine stable 1.3.  -Transition to PO lasix 20 mg daily, continue monitor creatine and lytes  -Strict I/Os -Daily weights -BMET in AM  Hypertension Patient has been non-adherent with blood pressure medications for quite sometime. Blood pressures on arrival to ED up to 230 systolic. Have improved 170-180 systolic, 90-100 diastolic, but will need  to improve prior to discharge. Started lisinopril. Given patient's history of crack cocaine use I do not feel comfortable prescribing a beta-blocker even though this would be beneficial for the patient's new HF diagnosis. Will start spironolactone as well, in addition to lasix and lisinopril. BP meds will likely need to be titrated up. Patient needs to establish with a PCP.  -Continue to monitor vitals -Lisinopril 5 mg daily -Spironolactone 12.5 mg daily    Anxiety, ?Bipolar Disorder Polysubstance Use Will keep the patient on CIWA without IV ativan. We will continue her PO xanax. If her CIWA score is elevated and she needs additional xanax please page MD for orders.   -Continue 0.5 mg of ativan BID PRN -Will continue CIWA without ativan -Social work consulted   Dispo: Anticipated discharge in approximately 1 day. Greatly appreciate social works help in having patient considered for PACE program.   Toney Rakes, MD 10/04/2017, 9:53 AM Pager: [phone]

## 2017-10-04 NOTE — NC FL2 (Addendum)
[place] MEDICAID FL2 LEVEL OF CARE SCREENING TOOL     IDENTIFICATION  Patient Name: Taylor Carrillo Birthdate: 06-02-1946 Sex: female Admission Date (Current Location): 10/02/2017  [facility] and [place] Number:  Producer, television/film/video and Address:  [facility], [address] [place], [place] [address]      Provider Number: [id number]  Attending Physician Name and Address:  Levert Feinstein, MD  Relative Name and Phone Number:  Leanor Voris, daughter, [phone]    Current Level of Care: Hospital Recommended Level of Care: Skilled Nursing Facility Prior Approval Number:    Date Approved/Denied:   PASRR Number:   [id number]  Discharge Plan: SNF    Current Diagnoses: Patient Active Problem List   Diagnosis Date Noted  . Hypertensive urgency 10/02/2017  . Dyspnea 10/02/2017  . Drug abuse (HCC) 10/02/2017  . Alcohol abuse 10/02/2017  . Diabetes type 2, uncontrolled (HCC) 10/02/2017  . Anxiety 10/02/2017  . Respiratory failure with hypoxia (HCC) 10/02/2017    Orientation RESPIRATION BLADDER Height & Weight     Self, Situation  Normal Continent Weight: 152 lb 16 oz (69.4 kg) Height:  5\' 6"  (167.6 cm)  BEHAVIORAL SYMPTOMS/MOOD NEUROLOGICAL BOWEL NUTRITION STATUS      Continent Diet(please see DC summary)  AMBULATORY STATUS COMMUNICATION OF NEEDS Skin   Extensive Assist Verbally Normal                       Personal Care Assistance Level of Assistance  Bathing, Feeding, Dressing Bathing Assistance: Limited assistance Feeding assistance: Independent Dressing Assistance: Limited assistance     Functional Limitations Info  Sight, Hearing, Speech Sight Info: Adequate Hearing Info: Adequate Speech Info: Adequate    SPECIAL CARE FACTORS FREQUENCY  PT (By licensed PT), OT (By licensed OT)     PT Frequency: 5x/week OT Frequency: 5x/week            Contractures Contractures Info: Not present    Additional Factors Info   Code Status, Allergies, Insulin Sliding Scale Code Status Info: Full Allergies Info: Shrimp (Shellfish Allergy)   Insulin Sliding Scale Info: insulin 3x/day with meals       Current Medications (10/04/2017):  This is the current hospital active medication list Current Facility-Administered Medications  Medication Dose Route Frequency Provider Last Rate Last Dose  . 0.9 %  sodium chloride infusion  250 mL Intravenous PRN 12/04/2017, MD      . acetaminophen (TYLENOL) tablet 650 mg  650 mg Oral Q6H PRN Valentino Nose, MD   650 mg at 10/03/17 1704   Or  . acetaminophen (TYLENOL) suppository 650 mg  650 mg Rectal Q6H PRN 12/03/17, MD      . albuterol (PROVENTIL) (2.5 MG/3ML) 0.083% nebulizer solution 2.5 mg  2.5 mg Nebulization Q2H PRN Valentino Nose, MD      . ALPRAZolam Valentino Nose) tablet 0.5 mg  0.5 mg Oral BID PRN Prudy Feeler, MD   0.5 mg at 10/04/17 0820  . aspirin EC tablet 81 mg  81 mg Oral Daily 12/04/17, MD   81 mg at 10/04/17 0820  . folic acid (FOLVITE) tablet 1 mg  1 mg Oral Daily 12/04/17, MD   1 mg at 10/04/17 12/04/17  . furosemide (LASIX) injection 40 mg  40 mg Intravenous Daily 6144, MD   40 mg at 10/04/17 0823  . heparin injection 5,000 Units  5,000 Units Subcutaneous Q8H 12/04/17, MD   5,000  Units at 10/04/17 0657  . hydrALAZINE (APRESOLINE) injection 5 mg  5 mg Intravenous Q6H PRN Valentino Nose, MD   5 mg at 10/03/17 1708  . Influenza vac split quadrivalent PF (FLUZONE HIGH-DOSE) injection 0.5 mL  0.5 mL Intramuscular Tomorrow-1000 Levert Feinstein, MD      . insulin aspart (novoLOG) injection 0-9 Units  0-9 Units Subcutaneous TID WC Valentino Nose, MD   1 Units at 10/02/17 1656  . lisinopril (PRINIVIL,ZESTRIL) tablet 5 mg  5 mg Oral Daily Molt, Bethany, DO   5 mg at 10/04/17 0820  . LORazepam (ATIVAN) injection 2-3 mg  2-3 mg Intravenous Q1H PRN Valentino Nose, MD   2 mg at 10/04/17 0503  . multivitamin with minerals  tablet 1 tablet  1 tablet Oral Daily Valentino Nose, MD   1 tablet at 10/04/17 [phone]  . nicotine (NICODERM CQ - dosed in mg/24 hours) patch 14 mg  14 mg Transdermal Daily Toney Rakes, MD   14 mg at 10/04/17 0825  . sodium chloride flush (NS) 0.9 % injection 3 mL  3 mL Intravenous Q12H Valentino Nose, MD   3 mL at 10/03/17 2200  . sodium chloride flush (NS) 0.9 % injection 3 mL  3 mL Intravenous Q12H Valentino Nose, MD   3 mL at 10/04/17 0823  . sodium chloride flush (NS) 0.9 % injection 3 mL  3 mL Intravenous PRN Valentino Nose, MD      . thiamine (VITAMIN B-1) tablet 100 mg  100 mg Oral Daily Valentino Nose, MD   100 mg at 10/04/17 8466     Discharge Medications: Please see discharge summary for a list of discharge medications.  Relevant Imaging Results:  Relevant Lab Results:   Additional Information SSN: [id number]  Abigail Butts, LCSW

## 2017-10-04 NOTE — Progress Notes (Signed)
Pt refused glucose check. Pt will be monitored closely.

## 2017-10-04 NOTE — Progress Notes (Signed)
Order placed by Dr. Hermenia Fiscal for violent restraints. Pt md to make aware pt is unsafe to herself and others at this point and needs higher level of care. Unable to violent restraints at this level of care. Pt has been 1 on 1 the entirety of the day. Pt daughter is aware of current situation. Cont to monitor. Emelda Brothers RN

## 2017-10-04 NOTE — Progress Notes (Addendum)
I have seen and examined the patient, and reviewed the daily progress note by Mo Eltilib, MS 3 and discussed the care of the patient with them. Please see my progress note from 10/04/2017 for further details regarding assessment and plan.    Signed:  Toney Rakes, MD 10/04/2017, 4:23 PM   Subjective: The patient was seen by the team this morning during rounds. No acute events overnight. Patient appears to be in a better mood than she was yesterday morning. Patient had no new complaints. ROS was negative Objective: Vital signs in last 24 hours: Vitals:   10/04/17 0400 10/04/17 0455 10/04/17 0500 10/04/17 0731  BP:  (!) 169/105 (!) 167/97 (!) 170/107  Pulse: 76 77 75 74  Resp: (!) 29  16 (!) 24  Temp:    (!) 97.5 F (36.4 C)  TempSrc:    Oral  SpO2: 95%  99% 97%  Weight:   69.4 kg (152 lb 16 oz)   Height:       Weight change: -1.2 kg (-10.3 oz)  Intake/Output Summary (Last 24 hours) at 10/04/2017 1022 Last data filed at 10/04/2017 0400 Gross per 24 hour  Intake 440 ml  Output 1150 ml  Net -710 ml   BP (!) 170/107 (BP Location: Left Arm)   Pulse 74   Temp (!) 97.5 F (36.4 C) (Oral)   Resp (!) 24   Ht 5\' 6"  (1.676 m)   Wt 69.4 kg (152 lb 16 oz)   LMP  (LMP Unknown)   SpO2 97%   BMI 24.69 kg/m     Neck:   Supple, symmetrical, trachea midline, no adenopathy;    thyroid:  no enlargement/tenderness/nodules; no carotid   bruit or JVD  Lungs:     Clear to auscultation bilaterally, respirations unlabored  Chest Wall:    No tenderness or deformity   Heart:    Regular rate and rhythm, S1 and S2 normal, no murmur, rub   or gallop  Abdomen:     Soft, non-tender, bowel sounds active all four quadrants,    no masses, no organomegaly  Extremities:   Extremities normal, atraumatic, no cyanosis or edema  Pulses:   2+ and symmetric all extremities  Skin:   Skin color, texture, turgor normal, no rashes or lesions         Micro Results: Recent Results (from the past 240  hour(s))  MRSA PCR Screening     Status: None   Collection Time: 10/02/17  3:34 PM  Result Value Ref Range Status   MRSA by PCR NEGATIVE NEGATIVE Final    Comment:        The GeneXpert MRSA Assay (FDA approved for NASAL specimens only), is one component of a comprehensive MRSA colonization surveillance program. It is not intended to diagnose MRSA infection nor to guide or monitor treatment for MRSA infections. Performed at [facility], [address], [place], [place]    Studies/Results: No results found. Medications: I have reviewed the patient's current medications. Scheduled Meds: . aspirin EC  81 mg Oral Daily  . folic acid  1 mg Oral Daily  . [START ON 10/05/2017] furosemide  20 mg Oral Daily  . heparin  5,000 Units Subcutaneous Q8H  . Influenza vac split quadrivalent PF  0.5 mL Intramuscular Tomorrow-1000  . insulin aspart  0-9 Units Subcutaneous TID WC  . lisinopril  5 mg Oral Daily  . multivitamin with minerals  1 tablet Oral Daily  . nicotine  14 mg Transdermal Daily  . sodium chloride flush  3 mL Intravenous Q12H  . sodium chloride flush  3 mL Intravenous Q12H  . spironolactone  12.5 mg Oral Daily  . thiamine  100 mg Oral Daily   Continuous Infusions: . sodium chloride     PRN Meds:.sodium chloride, acetaminophen **OR** acetaminophen, albuterol, ALPRAZolam, sodium chloride flush Assessment/Plan: Acute Systolic Heart Failure Exacerbation: Patient presented to the ED with acute worsening of SOB. Patient's echo showed signs of moderate concentric hypertrophy and left atrial dilation. Systolic function was reduced with estimated EF of 35-40%. Patient's CXR was showed pulmonary edema, bilateral pulmonary effusions, and cardiomegaly, most consistent with CHF.  -Patient will be switched from IV Lasix to PO Lasix 20 mg daily. CTM creatinine daily. Strict I/Os. Daily weights. BMET in AM.  Hypertension: Patient has long-standing uncontrolled HTN. She has  been non-adherent to her anti-hypertensivse. Patient's systolic BPs in the ED were in the 230s before being started on nitroglycerin IV drip. She was subsequently switched to Hydralazine 5mg  q6h PRN for Systolic Bps>180 and Lisinopril 5 mg daily. Her systolic BPs have decreased to the 170s.   -Continue Lisinopril 5 mg daily and initiate Spironolactone 12.5 mg daily to gain better control of her Bps before discharge.   Anxiety/ Possible BP Disorder:   -Continue 0.5 mg of ativan BID PRN -Will continue CIWA protocol with IV ativan -Social work consulted     This is a Note.  The care of the patient was discussed with Dr. Psychologist, occupational and the assessment and plan formulated with their assistance.  Please see their attached note for official documentation of the daily encounter.   LOS: 2 days   Minda Meo, Medical Student 10/04/2017, 10:22 AM

## 2017-10-04 NOTE — Consult Note (Signed)
Name: Taylor Carrillo MRN: [id number] DOB: 1946/05/20    ADMISSION DATE:  10/02/2017 CONSULTATION DATE:  10/04/17  REFERRING MD :  Cyndie Chime   CHIEF COMPLAINT:  AMS   HISTORY OF PRESENT ILLNESS:  Taylor Carrillo is a 72 y.o. female with a PMH as outlined below including polysubstance (cocaine/tobacco/THC) and EtOH abuse.  Reportedly last used cocaine around January 2019.  She was admitted 10/02/17 with PND felt to be due to CHF secondary to uncontrolled hypertension (non-compliant with meds) and was also started on CIWA protocol.  Afternoon of 3/7, she was anxious and agitated and had thoughts of leaving hospital.  Primary team saw pt at bedside and re-directed her.  She apparently later became more aggressive and agitated.  PCCM was called that afternoon to consider initiation of precedex and transfer to ICU. On review of MAR, pt had received 1mg  Ativan roughly 3-4 hours prior to this episode and 2mg  Haldol roughly 1 hour prior.  At the time of our evaluation at bedside, pt is asleep and vitals stable.  She is easy to arouse to voice.  She is alert to person and time but not to place (thinks she is in [place], ).  PAST MEDICAL HISTORY :   has a past medical history of Alcohol abuse, Cocaine abuse (HCC), CVA (cerebral vascular accident) (HCC), Diabetes mellitus without complication (HCC), Hypertension, and Tobacco abuse.  has a past surgical history that includes Abdominal hysterectomy. Prior to Admission medications   Medication Sig Start Date End Date Taking? Authorizing Provider  ALPRAZolam [place]) 0.5 MG tablet Take 0.5 mg by mouth 2 (two) times daily as needed for anxiety.   Yes [provider]   Allergies  Allergen Reactions  . Shrimp [Shellfish Allergy] Swelling    FAMILY HISTORY:  family history includes Diabetes in her daughter; Heart failure in her sister. SOCIAL HISTORY:  reports that she has been smoking cigarettes.  She has a 100.00 pack-year smoking history. she has  never used smokeless tobacco. She reports that she drinks about 8.4 oz of alcohol per week. She reports that she uses drugs. Drugs: Marijuana and Cocaine.  REVIEW OF SYSTEMS:  All negative; except for those that are bolded, which indicate positives.  Constitutional: weight loss, weight gain, night sweats, fevers, chills, fatigue, weakness.  HEENT: headaches, sore throat, sneezing, nasal congestion, post nasal drip, difficulty swallowing, tooth/dental problems, visual complaints, visual changes, ear aches. Neuro: difficulty with speech, weakness, numbness, ataxia. CV:  chest pain, orthopnea, PND, swelling in lower extremities, dizziness, palpitations, syncope.  Resp: cough, hemoptysis, dyspnea, wheezing. GI: heartburn, indigestion, abdominal pain, nausea, vomiting, diarrhea, constipation, change in bowel habits, loss of appetite, hematemesis, melena, hematochezia.  GU: dysuria, change in color of urine, urgency or frequency, flank pain, hematuria. MSK: joint pain or swelling, decreased range of motion. Psych: change in mood or affect, depression, anxiety, suicidal ideations, homicidal ideations. Skin: rash, itching, bruising.   SUBJECTIVE:  No complaints.  Feels tired.  VITAL SIGNS: Temp:  [97.5 F (36.4 C)-98.5 F (36.9 C)] 97.5 F (36.4 C) (03/07 1538) Pulse Rate:  [74-77] 76 (03/07 1141) Resp:  [13-29] 23 (03/07 1538) BP: (166-188)/(93-119) 187/113 (03/07 1538) SpO2:  [95 %-99 %] 97 % (03/07 0731) Weight:  [69.4 kg (152 lb 16 oz)] 69.4 kg (152 lb 16 oz) (03/07 0500)  PHYSICAL EXAMINATION: General: Adult female, resting in bed, in NAD. Neuro: Alert to person and time but not place, non-focal. HEENT: [place]/AT. EOMI, sclerae anicteric. Cardiovascular: RRR, no M/R/G.  Lungs: Respirations  even and unlabored.  CTA bilaterally, No W/R/R. Abdomen: BS x 4, soft, NT/ND.  Musculoskeletal: No gross deformities, no edema.  Skin: Intact, warm, no rashes.    Recent Labs  Lab 10/02/17 0405  10/03/17 0535 10/04/17 0512  NA 138 137 139  K 3.2* 4.2 3.7  CL 106 105 107  CO2 23 22 22   BUN 22* 27* 28*  CREATININE 1.37* 1.26* 1.30*  GLUCOSE 190* 114* 84   Recent Labs  Lab 10/02/17 0405  HGB 12.1  HCT 37.4  WBC 10.5  PLT 151   No results found.  STUDIES:  Echo 3/5 > EF 35 - 40%, mild MR, mod LA dilation, PAP 48, trivial pericardial effusion, small left pleural effusion.  SIGNIFICANT EVENTS  3/5 > admit. 3/7 > PCCM consult.  ASSESSMENT / PLAN:  Polysubstance abuse (cocaine / THC / tobacco / EtOH) - UDS on admit positive for opiates only, EtOH level < 10. Possible psychiatric disorder. Plan: Continue ativan per CIWA (dose and frequency increased). At this point, no role ICU transfer for precedex as pt has only received minimal ativan / haldol thus far. Polysubstance abuse counseling. Psychiatry consult placed by primary team.  Acute sCHF - likely due to uncontrolled HTN from medication non-compliance. Plan: Continue antihypertensives per primary team. Re-address importance of medication compliance, etc.   Nothing further to add.  PCCM will sign off.  Please do not hesitate to call 5/7 back if we can be of any further assistance.   [place], [place] Pager: [phone]  or [phone] 10/04/2017, 4:02 PM

## 2017-10-04 NOTE — Progress Notes (Signed)
Pt continuing to have episodes of confusion and agitation, unable to redirect at times also belligerent at times, oriented only to self, still difficult to redirect, has  improved with addition of ativan, sitter continues at bedside, Raymon Mutton RN

## 2017-10-05 DIAGNOSIS — Z560 Unemployment, unspecified: Secondary | ICD-10-CM

## 2017-10-05 DIAGNOSIS — R451 Restlessness and agitation: Secondary | ICD-10-CM

## 2017-10-05 DIAGNOSIS — Z736 Limitation of activities due to disability: Secondary | ICD-10-CM

## 2017-10-05 DIAGNOSIS — F17210 Nicotine dependence, cigarettes, uncomplicated: Secondary | ICD-10-CM

## 2017-10-05 DIAGNOSIS — F39 Unspecified mood [affective] disorder: Secondary | ICD-10-CM

## 2017-10-05 DIAGNOSIS — I509 Heart failure, unspecified: Secondary | ICD-10-CM

## 2017-10-05 DIAGNOSIS — F1099 Alcohol use, unspecified with unspecified alcohol-induced disorder: Secondary | ICD-10-CM

## 2017-10-05 DIAGNOSIS — F1290 Cannabis use, unspecified, uncomplicated: Secondary | ICD-10-CM

## 2017-10-05 DIAGNOSIS — R450 Nervousness: Secondary | ICD-10-CM

## 2017-10-05 LAB — GLUCOSE, CAPILLARY
GLUCOSE-CAPILLARY: 158 mg/dL — AB (ref 65–99)
GLUCOSE-CAPILLARY: 69 mg/dL (ref 65–99)
Glucose-Capillary: 73 mg/dL (ref 65–99)
Glucose-Capillary: 98 mg/dL (ref 65–99)

## 2017-10-05 LAB — CBC
HCT: 38.5 % (ref 36.0–46.0)
Hemoglobin: 12.5 g/dL (ref 12.0–15.0)
MCH: 31.2 pg (ref 26.0–34.0)
MCHC: 32.5 g/dL (ref 30.0–36.0)
MCV: 96 fL (ref 78.0–100.0)
Platelets: 167 10*3/uL (ref 150–400)
RBC: 4.01 MIL/uL (ref 3.87–5.11)
RDW: 14.2 % (ref 11.5–15.5)
WBC: 6.9 10*3/uL (ref 4.0–10.5)

## 2017-10-05 LAB — BASIC METABOLIC PANEL
Anion gap: 11 (ref 5–15)
BUN: 27 mg/dL — AB (ref 6–20)
CALCIUM: 8.4 mg/dL — AB (ref 8.9–10.3)
CO2: 22 mmol/L (ref 22–32)
Chloride: 107 mmol/L (ref 101–111)
Creatinine, Ser: 1.35 mg/dL — ABNORMAL HIGH (ref 0.44–1.00)
GFR calc Af Amer: 44 mL/min — ABNORMAL LOW (ref >60)
GFR, EST NON AFRICAN AMERICAN: 38 mL/min — AB (ref >60)
Glucose, Bld: 79 mg/dL (ref 65–99)
Potassium: 3.8 mmol/L (ref 3.5–5.1)
SODIUM: 140 mmol/L (ref 135–145)

## 2017-10-05 MED ORDER — ALUM & MAG HYDROXIDE-SIMETH 200-200-20 MG/5ML PO SUSP
30.0000 mL | Freq: Four times a day (QID) | ORAL | Status: DC | PRN
  Filled 2017-10-05: qty 30

## 2017-10-05 MED ORDER — DIVALPROEX SODIUM 250 MG PO DR TAB
250.0000 mg | DELAYED_RELEASE_TABLET | Freq: Two times a day (BID) | ORAL | Status: DC
  Administered 2017-10-05 – 2017-10-06 (×3): 250 mg via ORAL
  Filled 2017-10-05 (×3): qty 1

## 2017-10-05 MED ORDER — LISINOPRIL 10 MG PO TABS
10.0000 mg | ORAL_TABLET | Freq: Every day | ORAL | Status: DC
  Administered 2017-10-05 – 2017-10-06 (×2): 10 mg via ORAL
  Filled 2017-10-05 (×2): qty 1

## 2017-10-05 NOTE — Progress Notes (Signed)
Occupational Therapy Treatment Patient Details Name: Taylor Carrillo MRN: [id number] DOB: 1945/08/07 Today's Date: 10/05/2017    History of present illness Pt is a 72 y.o. female with current polysubstance abuse admitted 10/02/17 with worsening SOB; worked up for hypertensive urgency and systolic HF exacerbation. PMH includes polysubstance abuse (alcohol, cocaine, marijuana, tobacco), anxiety, CVA, CKD III.   OT comments  Client requires min guard during ADLs and functional mobility due to poor safety awareness and balance deficits. During treatment, Pt. engaged in grooming activity standing at the sink to address balance and cognitive deficits. Pt. shows progress in OT goals demonstrating selective attention as well as emergent awareness during task. Pt. Would benefit from continued skilled OT to address established goals. Recommendation is that pt. go to SNF upon d/c for continued therapy to address cognitive deficits, ADLs, and functional mobility.    Follow Up Recommendations  SNF;Supervision/Assistance - 24 hour    Equipment Recommendations  Other (comment)(TBD at next venue)    Recommendations for Other Services      Precautions / Restrictions Precautions Precautions: Fall Precaution Comments: CIWA, Safety sitter Restrictions Weight Bearing Restrictions: No       Mobility Bed Mobility Overal bed mobility: Needs Assistance Bed Mobility: Supine to Sit;Sit to Supine     Supine to sit: Supervision Sit to supine: Supervision   General bed mobility comments: Pt. was compliant with therapist instructions during bed mobility and only needed supervision due to poor safety awareness and balance issues.   Transfers Overall transfer level: Needs assistance Equipment used: Rolling walker (2 wheeled) Transfers: Sit to/from Stand Sit to Stand: Min guard         General transfer comment: for safety and poor balance. Cues for hand placement and safety    Balance Overall balance  assessment: Needs assistance Sitting-balance support: Feet supported;No upper extremity supported Sitting balance-Leahy Scale: Fair     Standing balance support: During functional activity;Bilateral upper extremity supported Standing balance-Leahy Scale: Poor Standing balance comment: Pt leaning on sink with bil UEs when completing grooming activity                           ADL either performed or assessed with clinical judgement   ADL Overall ADL's : Needs assistance/impaired     Grooming: Wash/dry face;Wash/dry hands;Standing;Cueing for safety;Min guard Grooming Details (indicate cue type and reason): Pt. was unaware of balance deficits during grooming task and was unable to stand upright without leaning on sink for support.                  Toilet Transfer: Min guard;Cueing for safety;Ambulation;RW Toilet Transfer Details (indicate cue type and reason): Pt. was able to complete peri care with min guard for balance/safety issues.  Toileting- Architect and Hygiene: Min guard;Sit to/from stand       Functional mobility during ADLs: Min guard;Cueing for safety;Rolling walker General ADL Comments: Pt. requires cueing for safety due to impaired balance and safety awareness. Pt. is overall min guard for ADLs. Pt. emotinal state was flucuating throughout treatment session.      Vision       Perception     Praxis      Cognition Arousal/Alertness: Awake/alert Behavior During Therapy: Impulsive Overall Cognitive Status: No family/caregiver present to determine baseline cognitive functioning Area of Impairment: Attention;Following commands;Safety/judgement;Problem solving                   Current Attention Level: Selective  Memory: Decreased short-term memory Following Commands: Follows one step commands consistently Safety/Judgement: Decreased awareness of safety;Decreased awareness of deficits Awareness: Emergent Problem Solving: Requires  verbal cues          Exercises     Shoulder Instructions       General Comments      Pertinent Vitals/ Pain       Pain Assessment: No/denies pain Faces Pain Scale: No hurt  Home Living                                          Prior Functioning/Environment              Frequency  Min 2X/week        Progress Toward Goals  OT Goals(current goals can now be found in the care plan section)  Progress towards OT goals: Progressing toward goals  Acute Rehab OT Goals Patient Stated Goal: Get out of the hospital because she is lonely OT Goal Formulation: With patient  Plan Discharge plan remains appropriate    Co-evaluation                 AM-PAC PT "6 Clicks" Daily Activity     Outcome Measure   Help from another person eating meals?: None Help from another person taking care of personal grooming?: A Little Help from another person toileting, which includes using toliet, bedpan, or urinal?: A Little Help from another person bathing (including washing, rinsing, drying)?: A Little Help from another person to put on and taking off regular upper body clothing?: A Little Help from another person to put on and taking off regular lower body clothing?: A Little 6 Click Score: 19    End of Session Equipment Utilized During Treatment: Gait belt;Rolling walker  OT Visit Diagnosis: Other abnormalities of gait and mobility (R26.89);Other symptoms and signs involving cognitive function;Ataxia, unspecified (R27.0)   Activity Tolerance Patient tolerated treatment well   Patient Left in bed;with call bell/phone within reach;with bed alarm set;with nursing/sitter in room   Nurse Communication Mobility status        Time: 1448-1856 OT Time Calculation (min): 23 min  Charges: OT General Charges $OT Visit: 1 Visit OT Treatments $Self Care/Home Management : 23-37 mins  Lysander Calixte A. Brett Albino, M.S., OTR/L Pager: [phone]   Gaye Alken 10/05/2017, 4:42 PM

## 2017-10-05 NOTE — Progress Notes (Signed)
   Subjective:  Patient seen and examined. No acute events overnight. Denies shortness of breath. Appears calm and less agitated this morning. States she was not feeling well yesterday and stated her stomach hurt. Did not elaborate further.   Objective:  Vital signs in last 24 hours: Vitals:   10/05/17 0545 10/05/17 0549 10/05/17 0751 10/05/17 1117  BP:  (!) 169/95 125/85 (!) 158/97  Pulse:   74 73  Resp: 16  (!) 31 14  Temp:   97.7 F (36.5 C) 97.8 F (36.6 C)  TempSrc:   Axillary Axillary  SpO2:   98% 98%  Weight:      Height:       General: Laying in bed comfortably, NAD HEENT: [place]/AT, EOMI, no scleral icterus, PERRL Cardiac: RRR, No R/M/G appreciated Pulm: normal effort, CTAB Abd: soft, non tender, non distended, BS normal Ext: extremities well perfused, no peripheral edema Neuro: alert and oriented X3, cranial nerves II-XII grossly intact   Assessment/Plan:  Principal Problem:   Respiratory failure with hypoxia (HCC) Active Problems:   Hypertensive urgency   Dyspnea   Drug abuse (HCC)   Alcohol abuse   Diabetes type 2, uncontrolled (HCC)   Anxiety  Acute Systolic Heart Failure Exacerbation Patient presented with acute onset dyspnea, hypoxia, BNP of 1730, and CXR consistent with pulmonary edema. ECHO with moderate concentric hypertrophy; systolic function was moderately reduced; estimated ejection fraction was in the range of 35% -40%. Moderate diffuse hypokinesis with no identifiable regional variations. Euvolemic on examination, no JVD or peripheral edema. Lung exam improved, no crackles or rales auscultated saturating 99% on RA. Weight on admission 155>>147 today. Creatinine stable 1.35.  -Transition to PO lasix 20 mg daily, continue monitor creatine and lytes  -Strict I/Os -Daily weights -BMET in AM  Hypertension Patient has been non-adherent with blood pressure medications for quite sometime. Blood pressures on arrival to ED up to 230 systolic. BP improved  158/97 today. On lisinopril 10 mg, Spironolactone 12.5 mg, and lasix 20 mg. Given patient's history of crack cocaine use I do not feel comfortable prescribing a beta-blocker even though this would be beneficial for the patient's new HF diagnosis. BP meds will likely need to be titrated up. Patient needs to establish with a PCP.  -Continue to monitor vitals -Lisinopril 10 mg daily -Spironolactone 12.5 mg daily   Agitation, Anxiety, ?Bipolar Disorder Polysubstance Use Patient became very agitated, requesting to leave and trying to get out of bed.  Initially, I was able to redirect the patient. She again became agitated and increasingly aggressive with nursing staff. Received multiple pages about the patient and the nursing administration of the floor was requesting a CCM consult and transfer to ICU for precedex, which I did not feel the patient required. Because of nursing requests I consutled PCCM. The patient responded to haldol and ativan and was less agitated when critical care evaluated her. PCCM signed off because she did not require ICU level care. This morning the patient is calm and cooperative. Per social work notes SNF placement has been difficult because of that patient's polysubstance use history. [facility] consulted, appreciate recs -if patient continues to be calm, will d/c bedside sitter -continue CIWA with ativan -Social work consulted - will touch base today   Dispo: Anticipated discharge in approximately 1-2 days. Greatly appreciate social works help in disposition planning and having patient considered for PACE program.   Toney Rakes, MD 10/05/2017, 12:29 PM Pager: [phone]

## 2017-10-05 NOTE — Progress Notes (Signed)
CSW received [facility]) authorization for patient to admit to SNF. However, patient still does not have any SNF bed offers due to substance use. CSW following for psychiatry recommendations and will support with disposition when bed identified.  Abigail Butts, LCSWA [phone]

## 2017-10-05 NOTE — Consult Note (Addendum)
[facility] Psychiatry Consult   Reason for Consult:  Capacity evaluation and questionable bipolar disorder Referring Physician: Dr. Beryle Beams  Patient Identification: Tranisha Tissue MRN:  [id number] Principal Diagnosis: Mood disorder [facility]) Diagnosis:   Patient Active Problem List   Diagnosis Date Noted  . Hypertensive urgency [I16.0] 10/02/2017  . Dyspnea [R06.00] 10/02/2017  . Drug abuse ([place]) [F19.10] 10/02/2017  . Alcohol abuse [F10.10] 10/02/2017  . Diabetes type 2, uncontrolled ([place]) [E11.65] 10/02/2017  . Anxiety [F41.9] 10/02/2017  . Respiratory failure with hypoxia [facility]) [J96.91] 10/02/2017    Total Time spent with patient: 1 hour  Subjective:   Kymari Lollis is a 72 y.o. female patient admitted with acute congestive heart failure.  HPI:  Per chart review, patient was admitted with acute congestive heart failure secondary to hypertensive cardiomyopathy in the setting of hypertension and poor medication compliance. She has been tangential in thought process and labile. She has also reported AVH. She previously thought that bugs were crawling in her ear. Daughter reports a long history of crack cocaine use. She has a history of bipolar disorder and was previously seeing a psychiatrist. She moved from [place] to be closer to her daughter. She has been able to easily obtain drugs and her daughter has felt overwhelmed with caring for her. UDS was positive for opiates on admission. She is being treated for alcohol withdrawal with a Ativan protocol although primary team do not believe that her presentation is secondary to alcohol withdrawal. She reportedly drinks 2-3 shots of hard liquor daily. She has received 5 mg of Ativan over the past day. She is prescribed Xanax 0.5 mg BID PRN by Dr. Waynard Reeds. Primary team does not feel like she has capacity to discharge AMA and to refuse SNF placement. She received Haldol 2 mg yesterday for agitation.   On interview, Ms. Broughton is  unable to report her medical condition that brought her to the hospital. She mentions that she is receiving treatment for her "blood." She is agreeable to go to a SNF because she would like help with improving her mobility and being able to socialize with other people. She believes that her recent move has contributed to isolation since she does not know anyone in this area yet. In regards to mood, she reports that she has a history of anxiety and depression. She reports manic symptoms (decreased need for sleep and increased energy) 10-20 years ago but she believes it was in the setting of cocaine use. She reports daily fluctuations in her mood. She reports feelings of sadness since her boyfriend died 6 months ago. She reports the loss of other loved ones. She denies SI, HI or AVH. She reports AH of "people telling her that they did not like her" 3 months ago. She stopped taking her medications months ago in the setting of depression. She reports that she would like to continue taking them because she feels differently now.   Past Psychiatric History: Bipolar disorder and cocaine abuse.   Risk to Self: Is patient at risk for suicide?: No Risk to Others:  None. Denies HI. Prior Inpatient Therapy:  She was hospitalized several years ago for depression. She denies a history of suicide attempts.  Prior Outpatient Therapy:  Denies. She was previously seeing a psychiatrist in [place].   Past Medical History:  Past Medical History:  Diagnosis Date  . Alcohol abuse   . Cocaine abuse ([place])   . CVA (cerebral vascular accident) ([place])   . Diabetes mellitus  without complication ([place])   . Hypertension   . Tobacco abuse     Past Surgical History:  Procedure Laterality Date  . ABDOMINAL HYSTERECTOMY     Family History:  Family History  Problem Relation Age of Onset  . Heart failure Sister   . Diabetes Daughter    Family Psychiatric  History: Unknown  Social History:  Social History   Substance and  Sexual Activity  Alcohol Use Yes  . Alcohol/week: 8.4 oz  . Types: 14 Standard drinks or equivalent per week   Comment: 2-3 shots of liquor daily     Social History   Substance and Sexual Activity  Drug Use Yes  . Types: Marijuana, Cocaine   Comment: TODAY    Social History   Socioeconomic History  . Marital status: Single    Spouse name: None  . Number of children: None  . Years of education: None  . Highest education level: None  Social Needs  . Financial resource strain: None  . Food insecurity - worry: None  . Food insecurity - inability: None  . Transportation needs - medical: None  . Transportation needs - non-medical: None  Occupational History  . None  Tobacco Use  . Smoking status: Current Every Day Smoker    Packs/day: 2.00    Years: 50.00    Pack years: 100.00    Types: Cigarettes  . Smokeless tobacco: Never Used  Substance and Sexual Activity  . Alcohol use: Yes    Alcohol/week: 8.4 oz    Types: 14 Standard drinks or equivalent per week    Comment: 2-3 shots of liquor daily  . Drug use: Yes    Types: Marijuana, Cocaine    Comment: TODAY  . Sexual activity: None  Other Topics Concern  . None  Social History Narrative   Recently moved to [place] to live with her Daughter.    Additional Social History: She is from [place]. She moved from [place] to [place] 2 months ago to live with her daughter. She has a younger daughter in [place]. She is unemployed and receives disability. She last worked 3 years ago. She reports a history of crack cocaine use. She denies use for 4-5 months. Her longest period of sobriety was 5 years. She has completed rehab and attended AA. She reports social alcohol use.     Allergies:   Allergies  Allergen Reactions  . Shrimp [Shellfish Allergy] Swelling    Labs:  Results for orders placed or performed during the hospital encounter of 10/02/17 (from the past 48 hour(s))  Troponin I (q 6hr x 3)     Status: Abnormal    Collection Time: 10/03/17  2:17 PM  Result Value Ref Range   Troponin I 0.03 (HH) <0.03 ng/mL    Comment: CRITICAL VALUE NOTED.  VALUE IS CONSISTENT WITH PREVIOUSLY REPORTED AND CALLED VALUE. Performed at [facility], [place] [address], [place], [place] [address]   Glucose, capillary     Status: None   Collection Time: 10/03/17  4:05 PM  Result Value Ref Range   Glucose-Capillary 94 65 - 99 mg/dL  Troponin I (q 6hr x 3)     Status: Abnormal   Collection Time: 10/03/17  7:34 PM  Result Value Ref Range   Troponin I 0.04 (HH) <0.03 ng/mL    Comment: CRITICAL VALUE NOTED.  VALUE IS CONSISTENT WITH PREVIOUSLY REPORTED AND CALLED VALUE. Performed at [facility], [place],  [place] [address]   Glucose, capillary     Status: None   Collection Time: 10/03/17  8:14 PM  Result Value Ref Range   Glucose-Capillary 94 65 - 99 mg/dL  Basic metabolic panel     Status: Abnormal   Collection Time: 10/04/17  5:12 AM  Result Value Ref Range   Sodium 139 135 - 145 mmol/L   Potassium 3.7 3.5 - 5.1 mmol/L   Chloride 107 101 - 111 mmol/L   CO2 22 22 - 32 mmol/L   Glucose, Bld 84 65 - 99 mg/dL   BUN 28 (H) 6 - 20 mg/dL   Creatinine, Ser 1.30 (H) 0.44 - 1.00 mg/dL   Calcium 8.3 (L) 8.9 - 10.3 mg/dL   GFR calc non Af Amer 40 (L) >60 mL/min   GFR calc Af Amer 46 (L) >60 mL/min    Comment: (NOTE) The eGFR has been calculated using the CKD EPI equation. This calculation has not been validated in all clinical situations. eGFR's persistently <60 mL/min signify possible Chronic Kidney Disease.    Anion gap 10 5 - 15    Comment: Performed at [facility] [address], [place], [place] [address]  Glucose, capillary     Status: None   Collection Time: 10/04/17  7:29 AM  Result Value Ref Range   Glucose-Capillary 80 65 - 99 mg/dL  Glucose, capillary     Status: Abnormal   Collection Time: 10/04/17 11:39 AM  Result Value Ref Range   Glucose-Capillary 219 (H) 65 - 99  mg/dL  Glucose, capillary     Status: Abnormal   Collection Time: 10/04/17  4:09 PM  Result Value Ref Range   Glucose-Capillary 52 (L) 65 - 99 mg/dL  Glucose, capillary     Status: Abnormal   Collection Time: 10/04/17  4:57 PM  Result Value Ref Range   Glucose-Capillary 111 (H) 65 - 99 mg/dL  Glucose, capillary     Status: None   Collection Time: 10/05/17  5:59 AM  Result Value Ref Range   Glucose-Capillary 73 65 - 99 mg/dL  Basic metabolic panel     Status: Abnormal   Collection Time: 10/05/17  6:00 AM  Result Value Ref Range   Sodium 140 135 - 145 mmol/L   Potassium 3.8 3.5 - 5.1 mmol/L   Chloride 107 101 - 111 mmol/L   CO2 22 22 - 32 mmol/L   Glucose, Bld 79 65 - 99 mg/dL   BUN 27 (H) 6 - 20 mg/dL   Creatinine, Ser 1.35 (H) 0.44 - 1.00 mg/dL   Calcium 8.4 (L) 8.9 - 10.3 mg/dL   GFR calc non Af Amer 38 (L) >60 mL/min   GFR calc Af Amer 44 (L) >60 mL/min    Comment: (NOTE) The eGFR has been calculated using the CKD EPI equation. This calculation has not been validated in all clinical situations. eGFR's persistently <60 mL/min signify possible Chronic Kidney Disease.    Anion gap 11 5 - 15    Comment: Performed at [facility] [address] [place] [address]  CBC     Status: None   Collection Time: 10/05/17  6:00 AM  Result Value Ref Range   WBC 6.9 4.0 - 10.5 K/uL   RBC 4.01 3.87 - 5.11 MIL/uL   Hemoglobin 12.5 12.0 - 15.0 g/dL   HCT 38.5 36.0 - 46.0 %   MCV 96.0 78.0 - 100.0 fL   MCH 31.2 26.0 - 34.0  pg   MCHC 32.5 30.0 - 36.0 g/dL   RDW 14.2 11.5 - 15.5 %   Platelets 167 150 - 400 K/uL    Comment: Performed at [facility], [place] [address] [place], [place] [address]  Glucose, capillary     Status: Abnormal   Collection Time: 10/05/17  7:11 AM  Result Value Ref Range   Glucose-Capillary 158 (H) 65 - 99 mg/dL  Glucose, capillary     Status: None   Collection Time: 10/05/17 11:20 AM  Result Value Ref Range   Glucose-Capillary 69 65 - 99  mg/dL    Current Facility-Administered Medications  Medication Dose Route Frequency Provider Last Rate Last Dose  . 0.9 %  sodium chloride infusion  250 mL Intravenous PRN Maryellen Pile, MD      . acetaminophen (TYLENOL) tablet 650 mg  650 mg Oral Q6H PRN Maryellen Pile, MD   650 mg at 10/03/17 1704   Or  . acetaminophen (TYLENOL) suppository 650 mg  650 mg Rectal Q6H PRN Maryellen Pile, MD      . albuterol (PROVENTIL) (2.5 MG/3ML) 0.083% nebulizer solution 2.5 mg  2.5 mg Nebulization Q2H PRN Maryellen Pile, MD      . aspirin EC tablet 81 mg  81 mg Oral Daily Maryellen Pile, MD   81 mg at 10/05/17 0751  . folic acid (FOLVITE) tablet 1 mg  1 mg Oral Daily Annia Belt, MD   1 mg at 10/05/17 0751  . furosemide (LASIX) tablet 20 mg  20 mg Oral Daily Molt, Bethany, DO   20 mg at 10/05/17 0751  . haloperidol lactate (HALDOL) injection 5 mg  5 mg Intravenous Once Molt, Bethany, DO      . heparin injection 5,000 Units  5,000 Units Subcutaneous Q8H Maryellen Pile, MD   5,000 Units at 10/04/17 [phone]  . insulin aspart (novoLOG) injection 0-9 Units  0-9 Units Subcutaneous TID WC Maryellen Pile, MD   2 Units at 10/05/17 [phone]  . lisinopril (PRINIVIL,ZESTRIL) tablet 10 mg  10 mg Oral Daily Melanee Spry, MD   10 mg at 10/05/17 0750  . LORazepam (ATIVAN) injection 0-2 mg  0-2 mg Intravenous Q2H PRN Shearon Stalls, Rahul P, PA-C   2 mg at 10/05/17 5366  . multivitamin with minerals tablet 1 tablet  1 tablet Oral Daily Maryellen Pile, MD   1 tablet at 10/05/17 0750  . nicotine (NICODERM CQ - dosed in mg/24 hours) patch 14 mg  14 mg Transdermal Daily Melanee Spry, MD   14 mg at 10/05/17 0748  . sodium chloride flush (NS) 0.9 % injection 3 mL  3 mL Intravenous Q12H Maryellen Pile, MD   3 mL at 10/03/17 2200  . sodium chloride flush (NS) 0.9 % injection 3 mL  3 mL Intravenous Q12H Maryellen Pile, MD   3 mL at 10/05/17 0756  . sodium chloride flush (NS) 0.9 % injection 3 mL  3 mL Intravenous  PRN Maryellen Pile, MD      . spironolactone (ALDACTONE) tablet 12.5 mg  12.5 mg Oral Daily Lacroce, Hulen Shouts, MD      . thiamine (VITAMIN B-1) tablet 100 mg  100 mg Oral Daily Maryellen Pile, MD   100 mg at 10/05/17 4403    Musculoskeletal: Strength & Muscle Tone: within normal limits Gait & Station: UTA since lying in bed. Patient leans: N/A  Psychiatric Specialty Exam: Physical Exam  Constitutional: She is oriented to person, place, and time. She appears  well-developed and well-nourished.  HENT:  Head: Normocephalic and atraumatic.  Neck: Normal range of motion.  Respiratory: Effort normal.  Musculoskeletal: Normal range of motion.  Neurological: She is alert and oriented to person, place, and time.  Psychiatric: Her speech is normal and behavior is normal. Thought content normal. Her affect is labile. Cognition and memory are normal. She expresses impulsivity.    Review of Systems  Psychiatric/Behavioral: Positive for depression. Negative for hallucinations, substance abuse and suicidal ideas. The patient is nervous/anxious. The patient does not have insomnia.   All other systems reviewed and are negative.   Blood pressure (!) 158/97, pulse 73, temperature 97.8 F (36.6 C), temperature source Axillary, resp. rate 14, height _0  (1.676 m), weight 67.1 kg (147 lb 14.9 oz), SpO2 98 %.Body mass index is 23.88 kg/m.  General Appearance: Fairly Groomed, elderly, African American female, short afro hair, wearing a hospital gown and lying in bed. NAD.   Eye Contact:  Good  Speech:  Clear and Coherent and Normal Rate  Volume:  Normal  Mood:  "Sad"  Affect:  Labile and Tearful  Thought Process:  Goal Directed, Linear and Descriptions of Associations: Intact  Orientation:  Full (Time, Place, and Person)  Thought Content:  Logical  Suicidal Thoughts:  No  Homicidal Thoughts:  No  Memory:  Immediate;   Fair Recent;   Fair Remote;   Fair  Judgement:  Fair  Insight:  Poor   Psychomotor Activity:  Normal  Concentration:  Concentration: Good and Attention Span: Good  Recall:  Good  Fund of Knowledge:  Fair  Language:  Fair  Akathisia:  No  Handed:  Right  AIMS (if indicated):   N/A  Assets:  Communication Skills Desire for Improvement Financial Resources/Insurance Housing Social Support  ADL's:  Intact  Cognition:  WNL  Sleep:   Okay   Assessment:  Lashanta Elbe is a 72 y.o. female who was admitted with acute congestive heart failure secondary to hypertensive cardiomyopathy in the setting of hypertension and poor medication compliance. She is labile in affect and intermittently tearful throughout interview. She reports a history of bipolar disorder and last manic episode about 10 years ago although this was in the setting of substance use. It is unclear if she has a confirmed history of bipolar disorder. She also reports a history of anxiety that is poorly managed. She has been taking Xanax for years. She may benefit from a mood stabilizer for mood lability. She has been intermittently agitated with psychosis and may benefit from a PRN antipsychotic. She appears to have responded well to Haldol yesterday. She continues to deny recent illicit substance use and reports social alcohol use. Her presentation has not been consistent with withdrawal from substances and may be secondary to poorly managed mood symptoms. She was pleasant and cooperative throughout interview. She denies SI, HI or current AVH. She does not warrant inpatient psychiatric hospitalization at this time. She demonstrates capacity to consent to SNF placement at this time as she is able to report how it would be beneficial for her. She does not demonstrate capacity to discharge AMA because she lacks an appreciation of the nature of her condition. She was unable to state the medical condition that she is receiving treatment for at this time.    Treatment Plan Summary: -Recommend starting Depakote 250  mg BID for mood stabilization. Can increase by 250 mg increments daily PRN until reach 500 mg BID. Obtain Depakote level in 3-4 days.  -Start  Haldol 2 mg BID PRN for agitation/psychosis.  -Recommend outpatient psychiatrist transition patient from Xanax to Klonopin since longer acting and less potential for addiction and rebound anxiety. Would eventually taper to discontinuation since increased risk for dementia, confusion and falls in elderly patients.  -Please have unit SW provide patient with resources for outpatient psychiatrists since she is new to this area.  -Recommend patient continue AA and NA to maintain sobriety. May be beneficial to also have other outpatient substance abuse resources.  -Please see end of assessment for details of capacity evaluation.  -Psychiatry will sign off on patient at this time. Please consult psychiatry again as needed.     Disposition: No evidence of imminent risk to self or others at present.   Patient does not meet criteria for psychiatric inpatient admission.  Faythe Dingwall, DO 10/05/2017 12:58 PM

## 2017-10-05 NOTE — Progress Notes (Signed)
Physical Therapy Treatment Patient Details Name: Taylor Carrillo MRN: [id number] DOB: 08/18/1945 Today's Date: 10/05/2017    History of Present Illness Pt is a 72 y.o. female with current polysubstance abuse admitted 10/02/17 with worsening SOB; worked up for hypertensive urgency and systolic HF exacerbation. PMH includes polysubstance abuse (alcohol, cocaine, marijuana, tobacco), anxiety, CVA, CKD III.    PT Comments    Pt making mild improvements.  Still impulsive, still a fall risk with balance deviation ambulating with RW.  Emphasis on transfer safety, gait stability.   Follow Up Recommendations  SNF;Supervision/Assistance - 24 hour     Equipment Recommendations  Rolling walker with 5" wheels    Recommendations for Other Services       Precautions / Restrictions Precautions Precautions: Fall Precaution Comments: CIWA    Mobility  Bed Mobility Overal bed mobility: Needs Assistance Bed Mobility: Supine to Sit     Supine to sit: Min guard     General bed mobility comments: climbed out between the rail and footboard before therapist could get the rail down.  Transfers Overall transfer level: Needs assistance Equipment used: Rolling walker (2 wheeled);None Transfers: Sit to/from Stand Sit to Stand: Min assist         General transfer comment: stability assist  Ambulation/Gait Ambulation/Gait assistance: Min assist;Mod assist;+2 safety/equipment Ambulation Distance (Feet): 300 Feet Assistive device: Rolling walker (2 wheeled);None Gait Pattern/deviations: Step-through pattern;Drifts right/left;Staggering right Gait velocity: Decreased Gait velocity interpretation: at or above normal speed for age/gender General Gait Details: pt was mildly unsteady overall with the RW.  pt needed min to mod assist for combination of stability of person and RW.  Pt not staggering as much, but drifting heavily right and with minor LOB with loss of focus.   Stairs             Wheelchair Mobility    Modified Rankin (Stroke Patients Only)       Balance Overall balance assessment: Needs assistance Sitting-balance support: No upper extremity supported Sitting balance-Leahy Scale: Fair       Standing balance-Leahy Scale: Poor Standing balance comment: Reliant on external assist to maintain balance                            Cognition Arousal/Alertness: Awake/alert Behavior During Therapy: Impulsive Overall Cognitive Status: No family/caregiver present to determine baseline cognitive functioning                     Current Attention Level: Sustained Memory: Decreased short-term memory Following Commands: Follows one step commands with increased time Safety/Judgement: Decreased awareness of deficits;Decreased awareness of safety Awareness: Intellectual          Exercises      General Comments        Pertinent Vitals/Pain Pain Assessment: Faces Faces Pain Scale: No hurt    Home Living                      Prior Function            PT Goals (current goals can now be found in the care plan section) Acute Rehab PT Goals Patient Stated Goal: Get out of the hospital because she is lonely PT Goal Formulation: With patient Time For Goal Achievement: 10/17/17 Potential to Achieve Goals: Good Progress towards PT goals: Progressing toward goals    Frequency    Min 2X/week      PT Plan Current plan  remains appropriate    Co-evaluation              AM-PAC PT "6 Clicks" Daily Activity  Outcome Measure  Difficulty turning over in bed (including adjusting bedclothes, sheets and blankets)?: None Difficulty moving from lying on back to sitting on the side of the bed? : None Difficulty sitting down on and standing up from a chair with arms (e.g., wheelchair, bedside commode, etc,.)?: Unable Help needed moving to and from a bed to chair (including a wheelchair)?: A Little Help needed walking in  hospital room?: A Lot Help needed climbing 3-5 steps with a railing? : A Lot 6 Click Score: 16    End of Session   Activity Tolerance: Patient tolerated treatment well Patient left: with call bell/phone within reach;with nursing/sitter in room Nurse Communication: Mobility status PT Visit Diagnosis: Other abnormalities of gait and mobility (R26.89);Unsteadiness on feet (R26.81)     Time: 6389-3734 PT Time Calculation (min) (ACUTE ONLY): 18 min  Charges:  $Gait Training: 8-22 mins                    G Codes:       22-Oct-2017  [place] Bing, PT [phone]  (pager)   Eliseo Gum Tanayia Wahlquist 10-22-2017, 3:40 PM

## 2017-10-06 LAB — BASIC METABOLIC PANEL
Anion gap: 10 (ref 5–15)
BUN: 32 mg/dL — AB (ref 6–20)
CALCIUM: 8.2 mg/dL — AB (ref 8.9–10.3)
CO2: 22 mmol/L (ref 22–32)
CREATININE: 1.46 mg/dL — AB (ref 0.44–1.00)
Chloride: 108 mmol/L (ref 101–111)
GFR calc Af Amer: 40 mL/min — ABNORMAL LOW (ref >60)
GFR, EST NON AFRICAN AMERICAN: 34 mL/min — AB (ref >60)
Glucose, Bld: 118 mg/dL — ABNORMAL HIGH (ref 65–99)
Potassium: 4 mmol/L (ref 3.5–5.1)
Sodium: 140 mmol/L (ref 135–145)

## 2017-10-06 LAB — GLUCOSE, CAPILLARY
Glucose-Capillary: 112 mg/dL — ABNORMAL HIGH (ref 65–99)
Glucose-Capillary: 104 mg/dL — ABNORMAL HIGH (ref 65–99)
Glucose-Capillary: 84 mg/dL (ref 65–99)
Glucose-Capillary: 188 mg/dL — ABNORMAL HIGH (ref 65–99)

## 2017-10-06 MED ORDER — ALPRAZOLAM 0.5 MG PO TABS
0.5000 mg | ORAL_TABLET | Freq: Two times a day (BID) | ORAL | Status: DC | PRN
  Administered 2017-10-06 – 2017-10-08 (×4): 0.5 mg via ORAL
  Filled 2017-10-06 (×4): qty 1

## 2017-10-06 MED ORDER — LORAZEPAM 0.5 MG PO TABS: 0.5000 mg | ORAL_TABLET | Freq: Two times a day (BID) | ORAL | Status: DC | PRN

## 2017-10-06 NOTE — Progress Notes (Signed)
   Subjective:  Patient seen and examined. No acute events overnight. Denies shortness of breath. Very calm this morning. States she had difficulty sleeping last night. She also is requesting to bathe. No other acute complaints.   Objective:  Vital signs in last 24 hours: Vitals:   10/05/17 2308 10/06/17 0521 10/06/17 0700 10/06/17 0802  BP: (!) 115/94 (!) 149/84  (!) 156/98  Pulse: 83 72 84 88  Resp: 16 20  19   Temp: 98.2 F (36.8 C) 98.4 F (36.9 C)  98 F (36.7 C)  TempSrc:  Axillary  Oral  SpO2: 96% 98%  100%  Weight:  151 lb 7.3 oz (68.7 kg)    Height:       General: Laying in bed comfortably, NAD HEENT: [place]/AT, EOMI, no scleral icterus, PERRL Cardiac: RRR, No R/M/G appreciated Pulm: normal effort, CTAB Abd: soft, non tender, non distended, BS normal Ext: extremities well perfused, no peripheral edema Neuro: alert and oriented X3, cranial nerves II-XII grossly intact   Assessment/Plan:  Principal Problem:   Mood disorder (HCC) Active Problems:   Hypertensive urgency   Dyspnea   Drug abuse (HCC)   Alcohol abuse   Diabetes type 2, uncontrolled (HCC)   Anxiety   Respiratory failure with hypoxia (HCC)  Acute Systolic Heart Failure Exacerbation Patient presented with acute onset dyspnea, hypoxia, BNP of 1730, and CXR consistent with pulmonary edema. ECHO with moderate concentric hypertrophy; systolic function was moderately reduced; estimated ejection fraction was in the range of 35% -40%. Moderate diffuse hypokinesis with no identifiable regional variations. Euvolemic on examination, no JVD or peripheral edema. Lung exam improved, no crackles or rales auscultated saturating 99% on RA. Blood pressures have improved.  -Continue PO lasix 20 mg daily -Strict I/Os -Daily weights -BMET in AM  Hypertension Patient has been non-adherent with blood pressure medications for quite sometime. Blood pressures on arrival to ED up to 230 systolic. BP improved 156/98 today. On  lisinopril 10 mg, Spironolactone 12.5 mg, and lasix 20 mg. Given patient's history of crack cocaine use I do not feel comfortable prescribing a beta-blocker even though this would be beneficial for the patient's new HF diagnosis.  -Lisinopril 10 mg daily -Spironolactone 12.5 mg daily  -Lasix 20 mg daily   Agitation, Anxiety History of Polysubstance Use Patient's agitation has seemed to resolved. She has not required a bed side sitter in 24 hours. She continues to have a labile affect during conversations, but her mood has seemed to stabilize.  Psychiatry evaluated the patient and recommended starting a mood stabilizer. Their recommendations were appreciated and we have started the patient on low dose depakote 250 mg BID. She has seemed to tolerate her first dose last night. Will continue with 250 mg BID today and may increase to 500 mg BID if patient continues to tolerate the medication. Patients discharge is currently pending SNF placement, which has been difficult due to her history of polysubstance use. The patient and patient's daughter state she has not used crack cocaine in over a month, which is consistent with her UDS.  -Continue Depakote 250 mg BID  -Will discontinue CIWA as patient is past withdrawal period and her agitation has resolved -Will continue Xanax 0.5 mg BID PRN for anxiety  -Social work consulted, greatly appreciate their help with placement    Dispo: Anticipated discharge in approximately 1-2 days pending SNF placement.  , MD 10/06/2017, 9:24 AM Pager: [phone]

## 2017-10-06 NOTE — Discharge Summary (Addendum)
Name: Taylor Carrillo MRN: [id number] DOB: 1945-12-21 72 y.o. PCP: System, Pcp Not In  Date of Admission: 10/02/2017  3:53 AM Date of Discharge:  Attending Physician: Levert Feinstein, MD  Discharge Diagnosis: 1. Acute Systolic Heart Failure Exacerbation Principal Problem:   Mood disorder (HCC) Active Problems:   Hypertensive urgency   Dyspnea   Drug abuse (HCC)   Alcohol abuse   Diabetes type 2, uncontrolled (HCC)   Anxiety   Respiratory failure with hypoxia [facility])   Discharge Medications: Allergies as of 10/08/2017      Reactions   Shrimp [shellfish Allergy] Swelling      Medication List    TAKE these medications   ALPRAZolam 0.5 MG tablet Commonly known as:  XANAX Take 1 tablet (0.5 mg total) by mouth 2 (two) times daily as needed for anxiety.   alum & mag hydroxide-simeth 200-200-20 MG/5ML suspension Commonly known as:  MAALOX/MYLANTA Take 30 mLs by mouth every 6 (six) hours as needed for indigestion or heartburn.   aspirin 81 MG EC tablet Take 1 tablet (81 mg total) by mouth daily. Start taking on:  10/09/2017   divalproex 500 MG DR tablet Commonly known as:  DEPAKOTE Take 1 tablet (500 mg total) by mouth 2 (two) times daily.   folic acid 1 MG tablet Commonly known as:  FOLVITE Take 1 tablet (1 mg total) by mouth daily. Start taking on:  10/09/2017   furosemide 20 MG tablet Commonly known as:  LASIX Take 1 tablet (20 mg total) by mouth daily. Start taking on:  10/09/2017   insulin aspart 100 UNIT/ML injection Commonly known as:  novoLOG Inject 0-9 Units into the skin 3 (three) times daily with meals.   lisinopril 20 MG tablet Commonly known as:  PRINIVIL,ZESTRIL Take 1 tablet (20 mg total) by mouth daily. Start taking on:  10/09/2017   multivitamin with minerals Tabs tablet Take 1 tablet by mouth daily. Start taking on:  10/09/2017   nicotine 14 mg/24hr patch Commonly known as:  NICODERM CQ - dosed in mg/24 hours Place 1 patch (14 mg total) onto  the skin daily. Start taking on:  10/09/2017   spironolactone 25 MG tablet Commonly known as:  ALDACTONE Take 1 tablet (25 mg total) by mouth daily. Start taking on:  10/09/2017   thiamine 100 MG tablet Take 1 tablet (100 mg total) by mouth daily. Start taking on:  10/09/2017            Durable Medical Equipment  (From admission, onward)        Start     Ordered   10/08/17 1243  For home use only DME Walker rolling  [facility])  Once    Question:  Patient needs a walker to treat with the following condition  Answer:  At high risk for injury related to fall   10/08/17 1243      Disposition and follow-up:   Taylor Carrillo was discharged from [facility] in Stable condition.  At the hospital follow up visit please address:  1.   Acute Systolic Heart Failure Exacerbation -Please resume Lasix 20 mg BID and Spironolactone 25 mg BID -Recommend daily weights and increase in lasix dose if increased weight, EDW 150 -Low salt diet, fluid restriction of no more than 2 liters daily -Discussed with patient's daughter with plans to follow up with [facility]  Hypertension -Monitor vitals daily  -Resume Lisinopril 20 mg daily, Lasix 20 mg daily, and Spironolactone 25  mg daily  -Please titrate lisinopril as needed for better BP control with goal of BP <130/80 -Please repeat a basic metabolic panel at follow up to monitor electrolytes and creatinine   Mood Disorder -Continue Depakote 500 mg BID -LFTs normal with AST 26, ALT 22 on day of discharge  -Please repeat hepatic function panel in 6 months to monitor LFTs if patient is till on depakote -Patient may benefit from outpatient psychiatry follow up   Type 2 DM -Hemoglobin A1C 4.9 -Patient's diabetes managed inpatient with sliding scale insulin, sensitive coverage -Sliding scale can be resumed at SNF -Recommend establishment of care with PCP for long term diabetes management  Deconditioning,  Fall risk -Patient was evaluated by physical therapy and occupation therapy -She is a high fall risk due to poor safety and deficit awareness, decreased balance, decreased strength, decreased activity tolerance -Please continue physical therapy and occupational therapy daily to increase strength  2.  Labs / imaging needed at time of follow-up: BMET at follow up; CMET or hepatic function panel in 6 months  3.  Pending labs/ test needing follow-up: none  Follow-up Appointments: Follow-up Information    [facility]. Schedule an appointment as soon as possible for a visit.   Why:  Please make an appointment after you leave rehabiltation.  Contact information: [address] [place] [address] [phone]          Hospital Course by problem list: Principal Problem:   Mood disorder [facility]) Active Problems:   Hypertensive urgency   Dyspnea   Drug abuse (HCC)   Alcohol abuse   Diabetes type 2, uncontrolled (HCC)   Anxiety   Respiratory failure with hypoxia (HCC)   1. Acute Systolic Heart Failure Exacerbation Taylor Carrillo presented to [facility] and was admitted to the Internal Medicine Teaching service for acute respiratory distress, pulmonary edema, and hypoxia. In the [facility], the patient was hypoxic to 82% on room air and was hypertensive with systolic blood pressures as high as 230. Her chest x-ray in the emergency room was notable for cardiomegaly with pulmonary edema and bilateral pleural effusions.  Her BNP returned at 1700.  EKG was only notable for sinus tachycardia with LVH and initial troponin was 0.00. She received an albuterol nebulizer treatment, Solu-Medrol, Lasix, and was started on a nitroglycerin infusion. Her respiratory status improved significantly after initial treatment with IV lasix. Echocardiogram revealed moderate concentric LV hypertrophy; systolic function was moderately reduced; estimated  ejection fraction was in the range of 35% -40%. Moderate diffuse hypokinesis with no identifiable regional variations. She was diuresed with IV lasix and transition to PO lasix with stabilization in her volume status. Her cardiopulmonary status remained stable the duration of her hospitalization.   2. Hypertension Taylor Carrillo's blood pressures were initially managed with IV hydralazine as needed.She was transitioned to PO lisinopril and spironolactone, which improved her blood pressures significantly. She was discharged on 20 mg of lisinopril daily and 25 mg of spironolactone daily. Given her history of cocaine use a beta-blocker was not initiated. These medications can be titrated up for better blood pressure control with a goal of less than 130/80. She will need to establish care with a primary care doctor to manage her hypertension. Discussed with patient's daughter she can follow up with [facility] and to schedule a follow up appointment after she is discharged from [facility].     3. Mood Disorder, History of Polysubstance use  The patient was having intermittent episodes of aggression and agitation, initially concerning for alcohol withdrawal. She was monitored with CIWA protocol and treated appropriately with ativan and did require a bed side sitter for a 24 hour period. The patient continued to have these episodes even outside of the window of alcohol withdrawal. Her mood and affect were labile during interactions with medical team, so psychiatry was consulted to evaluate the patient. Psychiatry recommended low dose Depakote as a mood stabilizer. The patient was discharged on 500 mg of Depakote BID. Her anxiety was also managed with 0.5 mg of Xanax daily, which she was taking prior to hospitalization. Xanax and Depakote were resumed at discharge. Please obatin a hepatic function panel in six months from discharge date to monitor liver function while on Depakote.   4. History  of Type 2 DM Hemoglobin A1C 4.9. She was placed on SSI for management. No oral medications were started. She will need to establish with a primary care provider for further monitoring and management of her diabetes.   Discharge Vitals:   BP (!) 155/100   Pulse 67   Temp 97.7 F (36.5 C) (Oral)   Resp 20   Ht 5\' 6"  (1.676 m)   Wt 149 lb 1.6 oz (67.6 kg)   LMP  (LMP Unknown)   SpO2 99%   BMI 24.07 kg/m   Pertinent Labs, Studies, and Procedures:  CBC Latest Ref Rng & Units 10/08/2017 10/07/2017 10/05/2017  WBC 4.0 - 10.5 K/uL 6.8 6.1 6.9  Hemoglobin 12.0 - 15.0 g/dL 12/05/2017 11.9(L) 12.5  Hematocrit 36.0 - 46.0 % 38.0 37.5 38.5  Platelets 150 - 400 K/uL 169 159 167   BMP Latest Ref Rng & Units 10/08/2017 10/07/2017 10/06/2017  Glucose 65 - 99 mg/dL 82 82 12/06/2017)  BUN 6 - 20 mg/dL 599(J) 57(S) 17(B)  Creatinine 0.44 - 1.00 mg/dL 93(J) 0.30(S) 9.23(R)  Sodium 135 - 145 mmol/L 139 139 140  Potassium 3.5 - 5.1 mmol/L 4.4 4.1 4.0  Chloride 101 - 111 mmol/L 107 108 108  CO2 22 - 32 mmol/L 24 22 22   Calcium 8.9 - 10.3 mg/dL 0.07(M) ) 2.2(Q)   BNP    Component Value Date/Time   BNP 1,730.2 (H) 10/02/2017 0405    PORTABLE CHEST 1 VIEW COMPARISON:  None.  FINDINGS: The heart is enlarged. Normal mediastinal contours. Moderate pulmonary edema. Bilateral pleural effusions, left greater than right. Associated bibasilar opacities likely compressive atelectasis. No pneumothorax. No acute osseous abnormalities are seen.  IMPRESSION: Moderate CHF with cardiomegaly, pulmonary edema and bilateral pleural effusions.  Transthoracic Echocardiogram ------------------------------------------------------------------- LV EF: 35% -   40%  ------------------------------------------------------------------- History:   PMH:   Dyspnea.  Risk factors:  Hypertension. Diabetes mellitus.  ------------------------------------------------------------------- Study Conclusions  - Left ventricle:  The cavity size was mildly dilated. There was   moderate concentric hypertrophy. Systolic function was moderately   reduced. The estimated ejection fraction was in the range of 35%   to 40%. Moderate diffuse hypokinesis with no identifiable   regional variations. - Aortic valve: Noncoronary cusp mobility was moderately   restricted. There was mild stenosis. There was moderate   regurgitation directed centrally in the LVOT. Valve area (VTI):   1.71 cm^2. - Mitral valve: Calcified annulus. There was mild regurgitation. - Left atrium: The atrium was moderately dilated. - Atrial septum: No defect or patent foramen ovale was identified. - Pulmonary arteries: Systolic pressure was mildly to moderately   increased. PA peak pressure: 48 mm Hg (S). -  Pericardium, extracardiac: A trivial pericardial effusion was   identified. There was a left pleural effusion.   Discharge Instructions: Discharge Instructions    (HEART FAILURE PATIENTS) Call MD:  Anytime you have any of the following symptoms: 1) 3 pound weight gain in 24 hours or 5 pounds in 1 week 2) shortness of breath, with or without a dry hacking cough 3) swelling in the hands, feet or stomach 4) if you have to sleep on extra pillows at night in order to breathe.   Complete by:  As directed    Call MD for:  difficulty breathing, headache or visual disturbances   Complete by:  As directed    Diet - low sodium heart healthy   Complete by:  As directed    Discharge instructions   Complete by:  As directed    Taylor Carrillo,   You were hospitalized for a heart failure exacerbation. You responded very well to our treatments. It is important you continue to take your medications for blood pressure. Your heart failure was caused by long standing untreated high blood pressure. I have prescribed you two medications for blood pressure: Lisinopril 20 mg and Spironolactone 25 mg, to be taken daily. These medications may need to be increased to get your  blood pressure under better control.   You also had intermittent episodes of agitation ans aggression toward staff. You were evaluated by the hospital psychiatrist. The psychiatrist felt that you would benefit from a mood stabilizing medication. This medication is called Depakote or Valproic Acid. Please take 500 mg of Depakote twice daily.   I believe you may benefit from a program in the [place] area called [facility]. Please have you or your daughter call to see if you qualify. The phone number is [phone].   I have provided you information for [facility]. They are a primary care clinic with doctors that can help manage your chronic medical conditions. Please call and make an appointment to establish care.   Increase activity slowly   Complete by:  As directed       Signed: Toney Rakes, MD 10/08/2017, 12:56 PM   Pager: [phone]

## 2017-10-07 LAB — CBC
HEMATOCRIT: 37.5 % (ref 36.0–46.0)
Hemoglobin: 11.9 g/dL — ABNORMAL LOW (ref 12.0–15.0)
MCH: 31 pg (ref 26.0–34.0)
MCHC: 31.7 g/dL (ref 30.0–36.0)
MCV: 97.7 fL (ref 78.0–100.0)
Platelets: 159 10*3/uL (ref 150–400)
RBC: 3.84 MIL/uL — AB (ref 3.87–5.11)
RDW: 14.3 % (ref 11.5–15.5)
WBC: 6.1 10*3/uL (ref 4.0–10.5)

## 2017-10-07 LAB — BASIC METABOLIC PANEL
ANION GAP: 9 (ref 5–15)
BUN: 29 mg/dL — ABNORMAL HIGH (ref 6–20)
CO2: 22 mmol/L (ref 22–32)
Calcium: 8.4 mg/dL — ABNORMAL LOW (ref 8.9–10.3)
Chloride: 108 mmol/L (ref 101–111)
Creatinine, Ser: 1.41 mg/dL — ABNORMAL HIGH (ref 0.44–1.00)
GFR, EST AFRICAN AMERICAN: 42 mL/min — AB (ref >60)
GFR, EST NON AFRICAN AMERICAN: 36 mL/min — AB (ref >60)
GLUCOSE: 82 mg/dL (ref 65–99)
POTASSIUM: 4.1 mmol/L (ref 3.5–5.1)
SODIUM: 139 mmol/L (ref 135–145)

## 2017-10-07 LAB — GLUCOSE, CAPILLARY
GLUCOSE-CAPILLARY: 139 mg/dL — AB (ref 65–99)
Glucose-Capillary: 102 mg/dL — ABNORMAL HIGH (ref 65–99)
Glucose-Capillary: 84 mg/dL (ref 65–99)
Glucose-Capillary: 163 mg/dL — ABNORMAL HIGH (ref 65–99)

## 2017-10-07 MED ORDER — LISINOPRIL 20 MG PO TABS
20.0000 mg | ORAL_TABLET | Freq: Every day | ORAL | Status: DC
  Administered 2017-10-07 – 2017-10-08 (×2): 20 mg via ORAL
  Filled 2017-10-07 (×2): qty 1

## 2017-10-07 MED ORDER — DIVALPROEX SODIUM 500 MG PO DR TAB
500.0000 mg | DELAYED_RELEASE_TABLET | Freq: Two times a day (BID) | ORAL | Status: DC
  Administered 2017-10-07 – 2017-10-08 (×3): 500 mg via ORAL
  Filled 2017-10-07 (×3): qty 1

## 2017-10-07 NOTE — Progress Notes (Addendum)
   Subjective:  Patient seen and examined. No acute events overnight. Denies shortness of breath. Mood seems more stable today.   Objective:  Vital signs in last 24 hours: Vitals:   10/06/17 2056 10/06/17 2355 10/07/17 0457 10/07/17 0738  BP: (!) 164/95 (!) 165/97 (!) 159/101 (!) 163/101  Pulse: 85 81 65 66  Resp: (!) 24 (!) 22 12 13   Temp:  (!) 97.4 F (36.3 C) 97.7 F (36.5 C) (!) 97.5 F (36.4 C)  TempSrc:  Axillary Oral Axillary  SpO2: 99% 94% 98% 100%  Weight:   147 lb 7.8 oz (66.9 kg)   Height:       General: Sitting on side of bed comfortably, NAD HEENT: [place]/AT, no scleral icterus,  Cardiac: RRR, No R/M/G appreciated Pulm: normal effort, CTAB Abd: soft, non tender, non distended, BS normal Ext: extremities well perfused, no peripheral edema Neuro: alert and oriented X3, cranial nerves II-XII grossly intact   Assessment/Plan:  Principal Problem:   Mood disorder (HCC) Active Problems:   Hypertensive urgency   Dyspnea   Drug abuse (HCC)   Alcohol abuse   Diabetes type 2, uncontrolled (HCC)   Anxiety   Respiratory failure with hypoxia (HCC)  Acute Systolic Heart Failure Exacerbation Patient presented with acute onset dyspnea, hypoxia, BNP of 1730, and CXR consistent with pulmonary edema. ECHO with moderate concentric hypertrophy; systolic function was moderately reduced; estimated ejection fraction was in the range of 35% -40%. Moderate diffuse hypokinesis with no identifiable regional variations. Euvolemic on examination, no JVD or peripheral edema. Lung exam improved, no crackles or rales auscultated saturating 99% on RA. Blood pressures have improved. Weight has been stable, I/Os with several unmeasured UOP, likely not accurate. Remains net negative -326 from admission. -Continue PO lasix 20 mg daily -Strict I/Os -Daily weights -BMET in AM  Hypertension Blood pressures on arrival to ED up to 230 systolic. BP 163/101 this AM. Would like better BP control. On  lisinopril 10 mg, Spironolactone 12.5 mg, and lasix 20 mg. No beta-blocker given history of crack cocaine use.  -Increase Lisinopril to 20 mg daily -Continue Spironolactone 12.5 mg daily  -Lasix 20 mg daily   Mood Disorder History of Polysubstance Use Patient's agitation has seemed to resolved, mood stable. She was less labile during evaluation this morning.  Psychiatry evaluated the patient and recommended starting a mood stabilizer. Their recommendations were appreciated and we have started the patient on low dose depakote 250 mg BID with plans to titrate up to 500 mg Bid. Patients discharge is currently pending SNF placement, which has been difficult due to her history of polysubstance use. The patient and patient's daughter state she has not used crack cocaine in over a month, which is consistent with her UDS.  -Increase Depakote to 500 mg BID  -Depakote level tomorrow, CMET tomorrow to monitor LFTs -Will continue Xanax 0.5 mg BID PRN for anxiety  -Social work consulted, greatly appreciate their help with placement   Dispo: Anticipated discharge pending SNF placement. If unable to find SNF may consider home health/PT/OT.  , MD 10/07/2017, 9:17 AM Pager: [phone]

## 2017-10-07 NOTE — Progress Notes (Signed)
Internal Medicine Attending:   I saw and examined the patient. I reviewed the resident's note and I agree with the resident's findings and plan as documented in the resident's note. Stable for d/c to SNF when bed available.

## 2017-10-07 NOTE — Plan of Care (Signed)
Plan d/c when accepting facility available. BP meds being adjusted for HTN.

## 2017-10-08 DIAGNOSIS — M6281 Muscle weakness (generalized): Secondary | ICD-10-CM

## 2017-10-08 DIAGNOSIS — F39 Unspecified mood [affective] disorder: Secondary | ICD-10-CM

## 2017-10-08 DIAGNOSIS — Z91013 Allergy to seafood: Secondary | ICD-10-CM

## 2017-10-08 DIAGNOSIS — Z9181 History of falling: Secondary | ICD-10-CM

## 2017-10-08 DIAGNOSIS — E119 Type 2 diabetes mellitus without complications: Secondary | ICD-10-CM

## 2017-10-08 LAB — COMPREHENSIVE METABOLIC PANEL
ALT: 22 U/L (ref 14–54)
ANION GAP: 8 (ref 5–15)
AST: 26 U/L (ref 15–41)
Albumin: 3 g/dL — ABNORMAL LOW (ref 3.5–5.0)
Alkaline Phosphatase: 70 U/L (ref 38–126)
BUN: 27 mg/dL — ABNORMAL HIGH (ref 6–20)
CALCIUM: 8.5 mg/dL — AB (ref 8.9–10.3)
CHLORIDE: 107 mmol/L (ref 101–111)
CO2: 24 mmol/L (ref 22–32)
Creatinine, Ser: 1.37 mg/dL — ABNORMAL HIGH (ref 0.44–1.00)
GFR, EST AFRICAN AMERICAN: 43 mL/min — AB (ref >60)
GFR, EST NON AFRICAN AMERICAN: 37 mL/min — AB (ref >60)
Glucose, Bld: 82 mg/dL (ref 65–99)
Potassium: 4.4 mmol/L (ref 3.5–5.1)
Sodium: 139 mmol/L (ref 135–145)
Total Bilirubin: 0.9 mg/dL (ref 0.3–1.2)
Total Protein: 6.6 g/dL (ref 6.5–8.1)

## 2017-10-08 LAB — CBC
HCT: 38 % (ref 36.0–46.0)
Hemoglobin: 12.2 g/dL (ref 12.0–15.0)
MCH: 31.4 pg (ref 26.0–34.0)
MCHC: 32.1 g/dL (ref 30.0–36.0)
MCV: 97.9 fL (ref 78.0–100.0)
PLATELETS: 169 10*3/uL (ref 150–400)
RBC: 3.88 MIL/uL (ref 3.87–5.11)
RDW: 14.3 % (ref 11.5–15.5)
WBC: 6.8 10*3/uL (ref 4.0–10.5)

## 2017-10-08 LAB — GLUCOSE, CAPILLARY
GLUCOSE-CAPILLARY: 95 mg/dL (ref 65–99)
Glucose-Capillary: 107 mg/dL — ABNORMAL HIGH (ref 65–99)
Glucose-Capillary: 133 mg/dL — ABNORMAL HIGH (ref 65–99)

## 2017-10-08 LAB — VALPROIC ACID LEVEL: VALPROIC ACID LVL: 43 ug/mL — AB (ref 50.0–100.0)

## 2017-10-08 MED ORDER — INSULIN ASPART 100 UNIT/ML ~~LOC~~ SOLN: 0.0000 [IU] | Freq: Three times a day (TID) | SUBCUTANEOUS | 11 refills | Status: AC

## 2017-10-08 MED ORDER — LISINOPRIL 20 MG PO TABS: 20.0000 mg | ORAL_TABLET | Freq: Every day | ORAL | 0 refills | Status: AC

## 2017-10-08 MED ORDER — NICOTINE 14 MG/24HR TD PT24: 14.0000 mg | MEDICATED_PATCH | Freq: Every day | TRANSDERMAL | 0 refills | Status: AC

## 2017-10-08 MED ORDER — SPIRONOLACTONE 25 MG PO TABS: 25.0000 mg | ORAL_TABLET | Freq: Every day | ORAL | 0 refills | Status: AC

## 2017-10-08 MED ORDER — FOLIC ACID 1 MG PO TABS: 1.0000 mg | ORAL_TABLET | Freq: Every day | ORAL | 0 refills | Status: AC

## 2017-10-08 MED ORDER — SPIRONOLACTONE 25 MG PO TABS: 25.0000 mg | ORAL_TABLET | Freq: Every day | ORAL | Status: DC

## 2017-10-08 MED ORDER — THIAMINE HCL 100 MG PO TABS: 100.0000 mg | ORAL_TABLET | Freq: Every day | ORAL | 0 refills | Status: AC

## 2017-10-08 MED ORDER — ADULT MULTIVITAMIN W/MINERALS CH: 1.0000 | ORAL_TABLET | Freq: Every day | ORAL | 0 refills | Status: AC

## 2017-10-08 MED ORDER — DIVALPROEX SODIUM 500 MG PO DR TAB: 500.0000 mg | DELAYED_RELEASE_TABLET | Freq: Two times a day (BID) | ORAL | 0 refills | Status: AC

## 2017-10-08 MED ORDER — ALPRAZOLAM 0.5 MG PO TABS: 0.5000 mg | ORAL_TABLET | Freq: Two times a day (BID) | ORAL | 0 refills | Status: DC | PRN

## 2017-10-08 MED ORDER — ALPRAZOLAM 0.5 MG PO TABS: 0.5000 mg | ORAL_TABLET | Freq: Two times a day (BID) | ORAL | 0 refills | Status: AC | PRN

## 2017-10-08 MED ORDER — ASPIRIN 81 MG PO TBEC: 81.0000 mg | DELAYED_RELEASE_TABLET | Freq: Every day | ORAL | Status: AC

## 2017-10-08 MED ORDER — FUROSEMIDE 20 MG PO TABS: 20.0000 mg | ORAL_TABLET | Freq: Every day | ORAL | 0 refills | Status: AC

## 2017-10-08 MED ORDER — ALUM & MAG HYDROXIDE-SIMETH 200-200-20 MG/5ML PO SUSP: 30.0000 mL | Freq: Four times a day (QID) | ORAL | 0 refills | Status: AC | PRN

## 2017-10-08 NOTE — Discharge Instructions (Signed)
Heart Failure °Heart failure means your heart has trouble pumping blood. This makes it hard for your body to work well. Heart failure is usually a long-term (chronic) condition. You must take good care of yourself and follow your doctor's treatment plan. °Follow these instructions at home: °· Take your heart medicine as told by your doctor. °? Do not stop taking medicine unless your doctor tells you to. °? Do not skip any dose of medicine. °? Refill your medicines before they run out. °? Take other medicines only as told by your doctor or pharmacist. °· Stay active if told by your doctor. The elderly and people with severe heart failure should talk with a doctor about physical activity. °· Eat heart-healthy foods. Choose foods that are without trans fat and are low in saturated fat, cholesterol, and salt (sodium). This includes fresh or frozen fruits and vegetables, fish, lean meats, fat-free or low-fat dairy foods, whole grains, and high-fiber foods. Lentils and dried peas and beans (legumes) are also good choices. °· Limit salt if told by your doctor. °· Cook in a healthy way. Roast, grill, broil, bake, poach, steam, or stir-fry foods. °· Limit fluids as told by your doctor. °· Weigh yourself every morning. Do this after you pee (urinate) and before you eat breakfast. Write down your weight to give to your doctor. °· Take your blood pressure and write it down if your doctor tells you to. °· Ask your doctor how to check your pulse. Check your pulse as told. °· Lose weight if told by your doctor. °· Stop smoking or chewing tobacco. Do not use gum or patches that help you quit without your doctor's approval. °· Schedule and go to doctor visits as told. °· Nonpregnant women should have no more than 1 drink a day. Men should have no more than 2 drinks a day. Talk to your doctor about drinking alcohol. °· Stop illegal drug use. °· Stay current with shots (immunizations). °· Manage your health conditions as told by your  doctor. °· Learn to manage your stress. °· Rest when you are tired. °· If it is really hot outside: °? Avoid intense activities. °? Use air conditioning or fans, or get in a cooler place. °? Avoid caffeine and alcohol. °? Wear loose-fitting, lightweight, and light-colored clothing. °· If it is really cold outside: °? Avoid intense activities. °? Layer your clothing. °? Wear mittens or gloves, a hat, and a scarf when going outside. °? Avoid alcohol. °· Learn about heart failure and get support as needed. °· Get help to maintain or improve your quality of life and your ability to care for yourself as needed. °Contact a doctor if: °· You gain weight quickly. °· You are more short of breath than usual. °· You cannot do your normal activities. °· You tire easily. °· You cough more than normal, especially with activity. °· You have any or more puffiness (swelling) in areas such as your hands, feet, ankles, or belly (abdomen). °· You cannot sleep because it is hard to breathe. °· You feel like your heart is beating fast (palpitations). °· You get dizzy or light-headed when you stand up. °Get help right away if: °· You have trouble breathing. °· There is a change in mental status, such as becoming less alert or not being able to focus. °· You have chest pain or discomfort. °· You faint. °This information is not intended to replace advice given to you by your health care provider. Make sure you   discuss any questions you have with your health care provider. °Document Released: 04/25/2008 Document Revised: 12/23/2015 Document Reviewed: 09/02/2012 °Elsevier Interactive Patient Education © 2017 [employer] ° °

## 2017-10-08 NOTE — Progress Notes (Signed)
   Subjective:  Patient seen and examined. No acute events overnight. Denies shortness of breath. Mood remains stable.   Objective:  Vital signs in last 24 hours: Vitals:   10/07/17 1954 10/08/17 0347 10/08/17 0753 10/08/17 1017  BP: (!) 150/87 (!) 171/99 (!) 180/90 (!) 155/100  Pulse: 87 69 67   Resp:   20   Temp: 98.4 F (36.9 C) 97.7 F (36.5 C) 97.7 F (36.5 C)   TempSrc: Oral Oral Oral   SpO2: 98% 100% 99%   Weight:  149 lb 1.6 oz (67.6 kg)    Height:       General: Sleeping in bed comfortably, NAD HEENT: [place]/AT, no scleral icterus,  Cardiac: RRR, No R/M/G appreciated Pulm: normal effort, CTAB Abd: soft, non tender, non distended, BS normal Ext: extremities well perfused, no peripheral edema Neuro: alert and oriented X3, cranial nerves II-XII grossly intact   Assessment/Plan:  Principal Problem:   Mood disorder (HCC) Active Problems:   Hypertensive urgency   Dyspnea   Drug abuse (HCC)   Alcohol abuse   Diabetes type 2, uncontrolled (HCC)   Anxiety   Respiratory failure with hypoxia (HCC)  Acute Systolic Heart Failure Exacerbation ECHO with moderate concentric hypertrophy; systolic function was moderately reduced; estimated ejection fraction was in the range of 35% -40%. Moderate diffuse hypokinesis with no identifiable regional variations. Euvolemic on examination, no JVD or peripheral edema. Lung exam CTAB, saturating 99% on RA. Weight stable, I/Os with several unmeasured UOP, likely not accurate. Remains net negative -796 from admission. K level and serum creatinine stable.  -Continue PO lasix 20 mg daily -Strict I/Os -Daily weights -BMET in AM  Hypertension BP 155/101 this AM. DBP consistently elevated. On lisinopril 20 mg, Spironolactone 12.5 mg, and lasix 20 mg. No beta-blocker given history of crack cocaine use.  -Continue Lisinopril to 20 mg daily -Increase Spironolactone to 25 mg daily  -Lasix 20 mg daily   Mood Disorder History of Polysubstance  Use Mood stable. Patient tolerating Depakote 500 mg BID. Depakote level 43 today. LFTs normal.  -Depakote to 500 mg BID  -Will need repeat LFTs in 6 months  -Will continue Xanax 0.5 mg BID PRN for anxiety  -Social work consulted, greatly appreciate their help with placement   Dispo: Anticipated discharge pending SNF placement, may have bed offer at [facility].   Toney Rakes, MD 10/08/2017, 10:34 AM Pager: [phone]

## 2017-10-08 NOTE — Progress Notes (Signed)
CSW following for SNF placement. Patient's [employer] authorization that was received last week is now expired and will require updated clinicals. CSW requested PT to see patient early in the day today so new notes can be sent to [facility]. Patient has one SNF bed offer electronically from [facility] CSW awaiting return call from [facility] to confirm bed. CSW to follow and support with disposition.  Abigail Butts, LCSWA [phone]

## 2017-10-08 NOTE — Progress Notes (Signed)
PT Cancellation Note  Patient Details Name: Taylor Carrillo MRN: [id number] DOB: 28-Aug-1945   Cancelled Treatment:    Reason Eval/Treat Not Completed: Other (comment); have attempted several times to see pt this morning.  Was bathing, receiving nursing care, now eating with family in the room.  Will attempt again later.   Elray Mcgregor 10/08/2017, 10:44 AM  Sheran Lawless, PT [phone] 10/08/2017

## 2017-10-08 NOTE — Progress Notes (Signed)
CSW received [facility] authorization for patient to admit to SNF. Auth reference number [id number].   Patient will discharge to [facility] Anticipated discharge date: 10/08/17 Family notified: Brynna Dobos, daughter Transportation by: PTAR  Nurse to call report to [phone]. Patient will go to [facility] at the facility.   CSW signing off.  Abigail Butts, LCSWA  Clinical Social Worker

## 2017-10-08 NOTE — Progress Notes (Signed)
Medicine attending discharge note: I personally examined this patient on the day of discharge and I attest to the accuracy of the evaluation and management plan as recorded in the final progress note by resident physician Dr. Landis Martins.  72 year old woman with a history of prior polysubstance abuse.  She recently relocated here about 1 month ago to live with 1 of her daughters to get her away from her drug and alcohol supply.  She presented on the day of admission March 5 with complaints of increasing dyspnea, orthopnea, and paroxysmal nocturnal dyspnea.  She had not been taking her antihypertensives for quite some time.  On initial exam blood pressure 237/142.  She was admitted for hypertensive urgency.  Oxygen saturation 82% on room air.  Bilateral wheezing heard over the lung fields.  A chest radiograph showed acute pulmonary edema.  BNP 1700.  EKG with sinus tachycardia, left ventricular hypertrophy, but no acute ischemic change and no arrhythmias.  No elevation of troponin enzymes. Initial attention directed towards blood pressure control.  Blood pressure stabilized on lisinopril titrated to 20 mg daily, Lasix 20 mg daily, Aldactone 25 mg daily.  At discharge blood pressure 155/100.  Occasional values still higher than that up to 180 systolic and 99 diastolic.  Oxygen saturation improved and was 99% on room air. She had initial signs of agitated behavior and was put on alcohol withdrawal protocol.  She required haloperidol to calm her down.  It appeared that there might be underlying psychopathology above and beyond her substance abuse disorder.  We asked for psychiatric consultation.  It was unclear from his interview with the patient whether or not she has a bipolar disorder.  He felt that she would benefit from a mood stabilizer in addition to an as needed antipsychotic.  He recommended starting Depakote 250 mg twice daily increased to 500 mg twice daily.  Use haloperidol 2 mg twice daily as  needed for any agitation or psychotic behavior.  He recommended transitioning from Xanax to Klonopin which is longer acting and has less potential for addiction and rebound anxiety. She is very calm and collected this morning.  General exam unremarkable.  No JVD.  No rales.  Resolved peripheral edema.  Disposition: Condition stable at time of discharge. She has been accepted to the [facility] nursing facility There were no complications

## 2017-10-08 NOTE — Progress Notes (Signed)
Physical Therapy Treatment Patient Details Name: Taylor Carrillo MRN: [id number] DOB: 1946-01-11 Today's Date: 10/08/2017    History of Present Illness Pt is a 72 y.o. female with current polysubstance abuse admitted 10/02/17 with worsening SOB; worked up for hypertensive urgency and systolic HF exacerbation. PMH includes polysubstance abuse (alcohol, cocaine, marijuana, tobacco), anxiety, CVA, CKD III.    PT Comments    Patient progressing slowly with ambulation, balance and safety.  Remains unaware of safety issues and was surprised when I spoke of rehab, though PT has discussed with her before.  Currently high fall risk due to poor safety and deficit awareness, decreased balance, decreased strength, decreased activity tolerance.  Continue skilled PT in acute setting prior to d/c to SNF.   Follow Up Recommendations  SNF;Supervision/Assistance - 24 hour     Equipment Recommendations  Rolling walker with 5" wheels    Recommendations for Other Services       Precautions / Restrictions Precautions Precautions: Fall Restrictions Weight Bearing Restrictions: No    Mobility  Bed Mobility   Bed Mobility: Supine to Sit;Sit to Supine     Supine to sit: Supervision Sit to supine: Supervision;HOB elevated   General bed mobility comments: impulsive getting up with candy in the bed and with sit to supine near foot of bed despite cues to move to [facility], scooted up with minguard after in supine  Transfers Overall transfer level: Needs assistance Equipment used: Rolling walker (2 wheeled) Transfers: Sit to/from Stand Sit to Stand: Min assist         General transfer comment: attempting on her own with slow progress and decreased safety, min A to rise successfully  Ambulation/Gait Ambulation/Gait assistance: Min assist;Mod assist Ambulation Distance (Feet): 200 Feet Assistive device: Rolling walker (2 wheeled) Gait Pattern/deviations: Step-through pattern;Decreased stride  length;Scissoring;Shuffle;Drifts right/left     General Gait Details: assist to keep walker from running into walls, assist for turning safely as pt picks up walker to turn it and min A to prevent falling, note also lateral hip instability more on R with ambulation   Stairs            Wheelchair Mobility    Modified Rankin (Stroke Patients Only)       Balance Overall balance assessment: Needs assistance   Sitting balance-Leahy Scale: Good     Standing balance support: Bilateral upper extremity supported Standing balance-Leahy Scale: Poor Standing balance comment: UE support needed for balance                            Cognition Arousal/Alertness: Awake/alert Behavior During Therapy: Impulsive Overall Cognitive Status: Impaired/Different from baseline Area of Impairment: Attention;Memory;Problem solving;Safety/judgement                   Current Attention Level: Selective Memory: Decreased short-term memory   Safety/Judgement: Decreased awareness of safety;Decreased awareness of deficits   Problem Solving: Requires verbal cues        Exercises      General Comments General comments (skin integrity, edema, etc.): c/o fatigue throughout and was asleep upon my entry, roused with mod cues      Pertinent Vitals/Pain Pain Assessment: No/denies pain    Home Living                      Prior Function            PT Goals (current goals can now be found in  the care plan section) Progress towards PT goals: Progressing toward goals(slowly)    Frequency    Min 2X/week      PT Plan Current plan remains appropriate    Co-evaluation              AM-PAC PT "6 Clicks" Daily Activity  Outcome Measure  Difficulty turning over in bed (including adjusting bedclothes, sheets and blankets)?: A Little Difficulty moving from lying on back to sitting on the side of the bed? : A Lot Difficulty sitting down on and standing up from  a chair with arms (e.g., wheelchair, bedside commode, etc,.)?: Unable Help needed moving to and from a bed to chair (including a wheelchair)?: A Little Help needed walking in hospital room?: A Lot Help needed climbing 3-5 steps with a railing? : A Lot 6 Click Score: 13    End of Session Equipment Utilized During Treatment: Gait belt Activity Tolerance: Patient limited by fatigue Patient left: with call bell/phone within reach;with bed alarm set;in bed   PT Visit Diagnosis: Other abnormalities of gait and mobility (R26.89);Unsteadiness on feet (R26.81)     Time: 5284-1324 PT Time Calculation (min) (ACUTE ONLY): 15 min  Charges:  $Gait Training: 8-22 mins                    G CodesSheran Lawless, [place] [phone] 10/08/2017    Elray Mcgregor 10/08/2017, 12:09 PM

## 2017-10-08 NOTE — Progress Notes (Signed)
PTAR received patient's discharge information and prescription. RN called report to [facility].

## 2017-10-08 NOTE — Clinical Social Work Placement (Signed)
   CLINICAL SOCIAL WORK PLACEMENT  NOTE  Date:  10/08/2017  Patient Details  Name: Taylor Carrillo MRN: [id number] Date of Birth: 09-28-1945  Clinical Social Work is seeking post-discharge placement for this patient at the Skilled  Nursing Facility level of care (*CSW will initial, date and re-position this form in  chart as items are completed):  Yes   Patient/family provided with [facility] Clinical Social Work Department's list of facilities offering this level of care within the geographic area requested by the patient (or if unable, by the patient's family).  Yes   Patient/family informed of their freedom to choose among providers that offer the needed level of care, that participate in Medicare, Medicaid or managed care program needed by the patient, have an available bed and are willing to accept the patient.  Yes   Patient/family informed of [facility]'s ownership interest in [facility] and [facility], as well as of the fact that they are under no obligation to receive care at these facilities.  PASRR submitted to EDS on 10/04/17     PASRR number received on 10/05/17     Existing PASRR number confirmed on       FL2 transmitted to all facilities in geographic area requested by pt/family on 10/04/17     FL2 transmitted to all facilities within larger geographic area on       Patient informed that his/her managed care company has contracts with or will negotiate with certain facilities, including the following:  [place]     Yes   Patient/family informed of bed offers received.  Patient chooses bed at [facility]     Physician recommends and patient chooses bed at      Patient to be transferred to [facility] on 10/08/17.  Patient to be transferred to facility by PTAR     Patient family notified on 10/08/17 of transfer.  Name of family member notified:  [name] Martorelli     PHYSICIAN Please prepare priority discharge summary, including medications, Please  prepare prescriptions     Additional Comment:    _______________________________________________ Abigail Butts, LCSW 10/08/2017, 2:35 PM

## 2017-11-28 DIAGNOSIS — I69315 Cognitive social or emotional deficit following cerebral infarction: Secondary | ICD-10-CM | POA: Diagnosis not present

## 2017-11-28 DIAGNOSIS — R569 Unspecified convulsions: Secondary | ICD-10-CM | POA: Diagnosis not present

## 2017-11-29 DIAGNOSIS — R569 Unspecified convulsions: Secondary | ICD-10-CM | POA: Diagnosis not present

## 2017-11-29 DIAGNOSIS — I69315 Cognitive social or emotional deficit following cerebral infarction: Secondary | ICD-10-CM | POA: Diagnosis not present

## 2017-11-30 DIAGNOSIS — I69315 Cognitive social or emotional deficit following cerebral infarction: Secondary | ICD-10-CM | POA: Diagnosis not present

## 2017-11-30 DIAGNOSIS — I110 Hypertensive heart disease with heart failure: Secondary | ICD-10-CM | POA: Diagnosis not present

## 2017-11-30 DIAGNOSIS — E782 Mixed hyperlipidemia: Secondary | ICD-10-CM | POA: Diagnosis not present

## 2017-11-30 DIAGNOSIS — R6889 Other general symptoms and signs: Secondary | ICD-10-CM | POA: Diagnosis not present

## 2017-11-30 DIAGNOSIS — E119 Type 2 diabetes mellitus without complications: Secondary | ICD-10-CM | POA: Diagnosis not present

## 2017-11-30 DIAGNOSIS — N189 Chronic kidney disease, unspecified: Secondary | ICD-10-CM | POA: Diagnosis not present

## 2017-11-30 DIAGNOSIS — F064 Anxiety disorder due to known physiological condition: Secondary | ICD-10-CM | POA: Diagnosis not present

## 2017-11-30 DIAGNOSIS — G40901 Epilepsy, unspecified, not intractable, with status epilepticus: Secondary | ICD-10-CM | POA: Diagnosis not present

## 2017-11-30 DIAGNOSIS — R569 Unspecified convulsions: Secondary | ICD-10-CM | POA: Diagnosis not present

## 2017-12-01 DIAGNOSIS — R569 Unspecified convulsions: Secondary | ICD-10-CM | POA: Diagnosis not present

## 2017-12-01 DIAGNOSIS — I69315 Cognitive social or emotional deficit following cerebral infarction: Secondary | ICD-10-CM | POA: Diagnosis not present

## 2017-12-02 DIAGNOSIS — R569 Unspecified convulsions: Secondary | ICD-10-CM | POA: Diagnosis not present

## 2017-12-02 DIAGNOSIS — I69315 Cognitive social or emotional deficit following cerebral infarction: Secondary | ICD-10-CM | POA: Diagnosis not present

## 2017-12-03 DIAGNOSIS — R569 Unspecified convulsions: Secondary | ICD-10-CM | POA: Diagnosis not present

## 2017-12-03 DIAGNOSIS — I69315 Cognitive social or emotional deficit following cerebral infarction: Secondary | ICD-10-CM | POA: Diagnosis not present

## 2017-12-04 DIAGNOSIS — I69315 Cognitive social or emotional deficit following cerebral infarction: Secondary | ICD-10-CM | POA: Diagnosis not present

## 2017-12-04 DIAGNOSIS — R569 Unspecified convulsions: Secondary | ICD-10-CM | POA: Diagnosis not present

## 2017-12-05 DIAGNOSIS — R569 Unspecified convulsions: Secondary | ICD-10-CM | POA: Diagnosis not present

## 2017-12-05 DIAGNOSIS — I69315 Cognitive social or emotional deficit following cerebral infarction: Secondary | ICD-10-CM | POA: Diagnosis not present

## 2017-12-06 DIAGNOSIS — I69315 Cognitive social or emotional deficit following cerebral infarction: Secondary | ICD-10-CM | POA: Diagnosis not present

## 2017-12-06 DIAGNOSIS — R569 Unspecified convulsions: Secondary | ICD-10-CM | POA: Diagnosis not present

## 2017-12-07 DIAGNOSIS — R569 Unspecified convulsions: Secondary | ICD-10-CM | POA: Diagnosis not present

## 2017-12-07 DIAGNOSIS — I69315 Cognitive social or emotional deficit following cerebral infarction: Secondary | ICD-10-CM | POA: Diagnosis not present

## 2017-12-08 DIAGNOSIS — R569 Unspecified convulsions: Secondary | ICD-10-CM | POA: Diagnosis not present

## 2017-12-08 DIAGNOSIS — I69315 Cognitive social or emotional deficit following cerebral infarction: Secondary | ICD-10-CM | POA: Diagnosis not present

## 2017-12-09 DIAGNOSIS — R569 Unspecified convulsions: Secondary | ICD-10-CM | POA: Diagnosis not present

## 2017-12-09 DIAGNOSIS — I69315 Cognitive social or emotional deficit following cerebral infarction: Secondary | ICD-10-CM | POA: Diagnosis not present

## 2017-12-10 DIAGNOSIS — R569 Unspecified convulsions: Secondary | ICD-10-CM | POA: Diagnosis not present

## 2017-12-10 DIAGNOSIS — I69315 Cognitive social or emotional deficit following cerebral infarction: Secondary | ICD-10-CM | POA: Diagnosis not present

## 2017-12-11 DIAGNOSIS — I69315 Cognitive social or emotional deficit following cerebral infarction: Secondary | ICD-10-CM | POA: Diagnosis not present

## 2017-12-11 DIAGNOSIS — R569 Unspecified convulsions: Secondary | ICD-10-CM | POA: Diagnosis not present

## 2017-12-12 DIAGNOSIS — R569 Unspecified convulsions: Secondary | ICD-10-CM | POA: Diagnosis not present

## 2017-12-12 DIAGNOSIS — I69315 Cognitive social or emotional deficit following cerebral infarction: Secondary | ICD-10-CM | POA: Diagnosis not present

## 2017-12-13 DIAGNOSIS — I69315 Cognitive social or emotional deficit following cerebral infarction: Secondary | ICD-10-CM | POA: Diagnosis not present

## 2017-12-13 DIAGNOSIS — R569 Unspecified convulsions: Secondary | ICD-10-CM | POA: Diagnosis not present

## 2017-12-14 DIAGNOSIS — R569 Unspecified convulsions: Secondary | ICD-10-CM | POA: Diagnosis not present

## 2017-12-14 DIAGNOSIS — I69315 Cognitive social or emotional deficit following cerebral infarction: Secondary | ICD-10-CM | POA: Diagnosis not present

## 2017-12-15 DIAGNOSIS — I69315 Cognitive social or emotional deficit following cerebral infarction: Secondary | ICD-10-CM | POA: Diagnosis not present

## 2017-12-15 DIAGNOSIS — R569 Unspecified convulsions: Secondary | ICD-10-CM | POA: Diagnosis not present

## 2017-12-16 DIAGNOSIS — I69315 Cognitive social or emotional deficit following cerebral infarction: Secondary | ICD-10-CM | POA: Diagnosis not present

## 2017-12-16 DIAGNOSIS — R569 Unspecified convulsions: Secondary | ICD-10-CM | POA: Diagnosis not present

## 2017-12-17 DIAGNOSIS — R569 Unspecified convulsions: Secondary | ICD-10-CM | POA: Diagnosis not present

## 2017-12-17 DIAGNOSIS — I69315 Cognitive social or emotional deficit following cerebral infarction: Secondary | ICD-10-CM | POA: Diagnosis not present

## 2017-12-18 DIAGNOSIS — I69315 Cognitive social or emotional deficit following cerebral infarction: Secondary | ICD-10-CM | POA: Diagnosis not present

## 2017-12-18 DIAGNOSIS — R569 Unspecified convulsions: Secondary | ICD-10-CM | POA: Diagnosis not present

## 2017-12-19 DIAGNOSIS — I69315 Cognitive social or emotional deficit following cerebral infarction: Secondary | ICD-10-CM | POA: Diagnosis not present

## 2017-12-19 DIAGNOSIS — R569 Unspecified convulsions: Secondary | ICD-10-CM | POA: Diagnosis not present

## 2017-12-20 DIAGNOSIS — I69315 Cognitive social or emotional deficit following cerebral infarction: Secondary | ICD-10-CM | POA: Diagnosis not present

## 2017-12-20 DIAGNOSIS — R569 Unspecified convulsions: Secondary | ICD-10-CM | POA: Diagnosis not present

## 2017-12-21 DIAGNOSIS — R569 Unspecified convulsions: Secondary | ICD-10-CM | POA: Diagnosis not present

## 2017-12-21 DIAGNOSIS — I69315 Cognitive social or emotional deficit following cerebral infarction: Secondary | ICD-10-CM | POA: Diagnosis not present

## 2017-12-22 DIAGNOSIS — R569 Unspecified convulsions: Secondary | ICD-10-CM | POA: Diagnosis not present

## 2017-12-22 DIAGNOSIS — I69315 Cognitive social or emotional deficit following cerebral infarction: Secondary | ICD-10-CM | POA: Diagnosis not present

## 2017-12-23 DIAGNOSIS — I69315 Cognitive social or emotional deficit following cerebral infarction: Secondary | ICD-10-CM | POA: Diagnosis not present

## 2017-12-23 DIAGNOSIS — R569 Unspecified convulsions: Secondary | ICD-10-CM | POA: Diagnosis not present

## 2017-12-24 DIAGNOSIS — I69315 Cognitive social or emotional deficit following cerebral infarction: Secondary | ICD-10-CM | POA: Diagnosis not present

## 2017-12-24 DIAGNOSIS — R569 Unspecified convulsions: Secondary | ICD-10-CM | POA: Diagnosis not present

## 2017-12-25 DIAGNOSIS — R569 Unspecified convulsions: Secondary | ICD-10-CM | POA: Diagnosis not present

## 2017-12-25 DIAGNOSIS — I69315 Cognitive social or emotional deficit following cerebral infarction: Secondary | ICD-10-CM | POA: Diagnosis not present

## 2017-12-26 DIAGNOSIS — I69315 Cognitive social or emotional deficit following cerebral infarction: Secondary | ICD-10-CM | POA: Diagnosis not present

## 2017-12-26 DIAGNOSIS — R569 Unspecified convulsions: Secondary | ICD-10-CM | POA: Diagnosis not present

## 2017-12-27 DIAGNOSIS — I69315 Cognitive social or emotional deficit following cerebral infarction: Secondary | ICD-10-CM | POA: Diagnosis not present

## 2017-12-27 DIAGNOSIS — R32 Unspecified urinary incontinence: Secondary | ICD-10-CM | POA: Diagnosis not present

## 2017-12-27 DIAGNOSIS — R569 Unspecified convulsions: Secondary | ICD-10-CM | POA: Diagnosis not present

## 2017-12-29 DIAGNOSIS — R569 Unspecified convulsions: Secondary | ICD-10-CM | POA: Diagnosis not present

## 2017-12-29 DIAGNOSIS — I69315 Cognitive social or emotional deficit following cerebral infarction: Secondary | ICD-10-CM | POA: Diagnosis not present

## 2017-12-30 DIAGNOSIS — I69315 Cognitive social or emotional deficit following cerebral infarction: Secondary | ICD-10-CM | POA: Diagnosis not present

## 2017-12-30 DIAGNOSIS — R569 Unspecified convulsions: Secondary | ICD-10-CM | POA: Diagnosis not present

## 2017-12-31 DIAGNOSIS — I69315 Cognitive social or emotional deficit following cerebral infarction: Secondary | ICD-10-CM | POA: Diagnosis not present

## 2017-12-31 DIAGNOSIS — R569 Unspecified convulsions: Secondary | ICD-10-CM | POA: Diagnosis not present

## 2018-01-01 DIAGNOSIS — I69315 Cognitive social or emotional deficit following cerebral infarction: Secondary | ICD-10-CM | POA: Diagnosis not present

## 2018-01-01 DIAGNOSIS — R569 Unspecified convulsions: Secondary | ICD-10-CM | POA: Diagnosis not present

## 2018-01-02 DIAGNOSIS — I69315 Cognitive social or emotional deficit following cerebral infarction: Secondary | ICD-10-CM | POA: Diagnosis not present

## 2018-01-02 DIAGNOSIS — R569 Unspecified convulsions: Secondary | ICD-10-CM | POA: Diagnosis not present

## 2018-01-03 DIAGNOSIS — R569 Unspecified convulsions: Secondary | ICD-10-CM | POA: Diagnosis not present

## 2018-01-03 DIAGNOSIS — I69315 Cognitive social or emotional deficit following cerebral infarction: Secondary | ICD-10-CM | POA: Diagnosis not present

## 2018-01-04 DIAGNOSIS — R569 Unspecified convulsions: Secondary | ICD-10-CM | POA: Diagnosis not present

## 2018-01-04 DIAGNOSIS — I69315 Cognitive social or emotional deficit following cerebral infarction: Secondary | ICD-10-CM | POA: Diagnosis not present

## 2018-01-05 DIAGNOSIS — R569 Unspecified convulsions: Secondary | ICD-10-CM | POA: Diagnosis not present

## 2018-01-05 DIAGNOSIS — I69315 Cognitive social or emotional deficit following cerebral infarction: Secondary | ICD-10-CM | POA: Diagnosis not present

## 2018-01-06 DIAGNOSIS — R569 Unspecified convulsions: Secondary | ICD-10-CM | POA: Diagnosis not present

## 2018-01-06 DIAGNOSIS — I69315 Cognitive social or emotional deficit following cerebral infarction: Secondary | ICD-10-CM | POA: Diagnosis not present

## 2018-01-07 DIAGNOSIS — R569 Unspecified convulsions: Secondary | ICD-10-CM | POA: Diagnosis not present

## 2018-01-07 DIAGNOSIS — I69315 Cognitive social or emotional deficit following cerebral infarction: Secondary | ICD-10-CM | POA: Diagnosis not present

## 2018-01-08 DIAGNOSIS — R569 Unspecified convulsions: Secondary | ICD-10-CM | POA: Diagnosis not present

## 2018-01-08 DIAGNOSIS — I69315 Cognitive social or emotional deficit following cerebral infarction: Secondary | ICD-10-CM | POA: Diagnosis not present

## 2018-01-09 DIAGNOSIS — R569 Unspecified convulsions: Secondary | ICD-10-CM | POA: Diagnosis not present

## 2018-01-09 DIAGNOSIS — I69315 Cognitive social or emotional deficit following cerebral infarction: Secondary | ICD-10-CM | POA: Diagnosis not present

## 2018-01-10 DIAGNOSIS — I69315 Cognitive social or emotional deficit following cerebral infarction: Secondary | ICD-10-CM | POA: Diagnosis not present

## 2018-01-10 DIAGNOSIS — R569 Unspecified convulsions: Secondary | ICD-10-CM | POA: Diagnosis not present

## 2018-01-11 DIAGNOSIS — R569 Unspecified convulsions: Secondary | ICD-10-CM | POA: Diagnosis not present

## 2018-01-11 DIAGNOSIS — I69315 Cognitive social or emotional deficit following cerebral infarction: Secondary | ICD-10-CM | POA: Diagnosis not present

## 2018-01-12 DIAGNOSIS — I69315 Cognitive social or emotional deficit following cerebral infarction: Secondary | ICD-10-CM | POA: Diagnosis not present

## 2018-01-12 DIAGNOSIS — R569 Unspecified convulsions: Secondary | ICD-10-CM | POA: Diagnosis not present

## 2018-01-13 DIAGNOSIS — I69315 Cognitive social or emotional deficit following cerebral infarction: Secondary | ICD-10-CM | POA: Diagnosis not present

## 2018-01-13 DIAGNOSIS — R569 Unspecified convulsions: Secondary | ICD-10-CM | POA: Diagnosis not present

## 2018-01-14 DIAGNOSIS — I69315 Cognitive social or emotional deficit following cerebral infarction: Secondary | ICD-10-CM | POA: Diagnosis not present

## 2018-01-14 DIAGNOSIS — R569 Unspecified convulsions: Secondary | ICD-10-CM | POA: Diagnosis not present

## 2018-01-15 DIAGNOSIS — R569 Unspecified convulsions: Secondary | ICD-10-CM | POA: Diagnosis not present

## 2018-01-15 DIAGNOSIS — I69315 Cognitive social or emotional deficit following cerebral infarction: Secondary | ICD-10-CM | POA: Diagnosis not present

## 2018-01-16 DIAGNOSIS — R569 Unspecified convulsions: Secondary | ICD-10-CM | POA: Diagnosis not present

## 2018-01-16 DIAGNOSIS — I69315 Cognitive social or emotional deficit following cerebral infarction: Secondary | ICD-10-CM | POA: Diagnosis not present

## 2018-01-17 DIAGNOSIS — I69315 Cognitive social or emotional deficit following cerebral infarction: Secondary | ICD-10-CM | POA: Diagnosis not present

## 2018-01-17 DIAGNOSIS — R569 Unspecified convulsions: Secondary | ICD-10-CM | POA: Diagnosis not present

## 2018-01-18 DIAGNOSIS — I69315 Cognitive social or emotional deficit following cerebral infarction: Secondary | ICD-10-CM | POA: Diagnosis not present

## 2018-01-18 DIAGNOSIS — R569 Unspecified convulsions: Secondary | ICD-10-CM | POA: Diagnosis not present

## 2018-01-19 DIAGNOSIS — R569 Unspecified convulsions: Secondary | ICD-10-CM | POA: Diagnosis not present

## 2018-01-19 DIAGNOSIS — I69315 Cognitive social or emotional deficit following cerebral infarction: Secondary | ICD-10-CM | POA: Diagnosis not present

## 2018-01-20 DIAGNOSIS — R569 Unspecified convulsions: Secondary | ICD-10-CM | POA: Diagnosis not present

## 2018-01-20 DIAGNOSIS — I69315 Cognitive social or emotional deficit following cerebral infarction: Secondary | ICD-10-CM | POA: Diagnosis not present

## 2018-01-21 DIAGNOSIS — I69315 Cognitive social or emotional deficit following cerebral infarction: Secondary | ICD-10-CM | POA: Diagnosis not present

## 2018-01-21 DIAGNOSIS — R569 Unspecified convulsions: Secondary | ICD-10-CM | POA: Diagnosis not present

## 2018-01-22 DIAGNOSIS — I69315 Cognitive social or emotional deficit following cerebral infarction: Secondary | ICD-10-CM | POA: Diagnosis not present

## 2018-01-22 DIAGNOSIS — R569 Unspecified convulsions: Secondary | ICD-10-CM | POA: Diagnosis not present

## 2018-01-23 DIAGNOSIS — I69315 Cognitive social or emotional deficit following cerebral infarction: Secondary | ICD-10-CM | POA: Diagnosis not present

## 2018-01-23 DIAGNOSIS — R569 Unspecified convulsions: Secondary | ICD-10-CM | POA: Diagnosis not present

## 2018-01-24 DIAGNOSIS — R569 Unspecified convulsions: Secondary | ICD-10-CM | POA: Diagnosis not present

## 2018-01-24 DIAGNOSIS — I69315 Cognitive social or emotional deficit following cerebral infarction: Secondary | ICD-10-CM | POA: Diagnosis not present

## 2018-01-25 DIAGNOSIS — I69315 Cognitive social or emotional deficit following cerebral infarction: Secondary | ICD-10-CM | POA: Diagnosis not present

## 2018-01-25 DIAGNOSIS — R569 Unspecified convulsions: Secondary | ICD-10-CM | POA: Diagnosis not present

## 2018-01-26 DIAGNOSIS — R569 Unspecified convulsions: Secondary | ICD-10-CM | POA: Diagnosis not present

## 2018-01-26 DIAGNOSIS — I69315 Cognitive social or emotional deficit following cerebral infarction: Secondary | ICD-10-CM | POA: Diagnosis not present

## 2018-01-27 DIAGNOSIS — I69315 Cognitive social or emotional deficit following cerebral infarction: Secondary | ICD-10-CM | POA: Diagnosis not present

## 2018-01-27 DIAGNOSIS — R569 Unspecified convulsions: Secondary | ICD-10-CM | POA: Diagnosis not present

## 2018-01-28 DIAGNOSIS — R569 Unspecified convulsions: Secondary | ICD-10-CM | POA: Diagnosis not present

## 2018-01-28 DIAGNOSIS — I69315 Cognitive social or emotional deficit following cerebral infarction: Secondary | ICD-10-CM | POA: Diagnosis not present

## 2018-01-29 DIAGNOSIS — I69315 Cognitive social or emotional deficit following cerebral infarction: Secondary | ICD-10-CM | POA: Diagnosis not present

## 2018-01-29 DIAGNOSIS — R569 Unspecified convulsions: Secondary | ICD-10-CM | POA: Diagnosis not present

## 2018-01-30 DIAGNOSIS — I69315 Cognitive social or emotional deficit following cerebral infarction: Secondary | ICD-10-CM | POA: Diagnosis not present

## 2018-01-30 DIAGNOSIS — R569 Unspecified convulsions: Secondary | ICD-10-CM | POA: Diagnosis not present

## 2018-01-31 DIAGNOSIS — R569 Unspecified convulsions: Secondary | ICD-10-CM | POA: Diagnosis not present

## 2018-01-31 DIAGNOSIS — I69315 Cognitive social or emotional deficit following cerebral infarction: Secondary | ICD-10-CM | POA: Diagnosis not present

## 2018-02-01 DIAGNOSIS — R569 Unspecified convulsions: Secondary | ICD-10-CM | POA: Diagnosis not present

## 2018-02-01 DIAGNOSIS — I69315 Cognitive social or emotional deficit following cerebral infarction: Secondary | ICD-10-CM | POA: Diagnosis not present

## 2018-02-02 DIAGNOSIS — R569 Unspecified convulsions: Secondary | ICD-10-CM | POA: Diagnosis not present

## 2018-02-02 DIAGNOSIS — I69315 Cognitive social or emotional deficit following cerebral infarction: Secondary | ICD-10-CM | POA: Diagnosis not present

## 2018-02-03 DIAGNOSIS — R569 Unspecified convulsions: Secondary | ICD-10-CM | POA: Diagnosis not present

## 2018-02-03 DIAGNOSIS — I69315 Cognitive social or emotional deficit following cerebral infarction: Secondary | ICD-10-CM | POA: Diagnosis not present

## 2018-02-03 DIAGNOSIS — R32 Unspecified urinary incontinence: Secondary | ICD-10-CM | POA: Diagnosis not present

## 2018-02-04 DIAGNOSIS — I69315 Cognitive social or emotional deficit following cerebral infarction: Secondary | ICD-10-CM | POA: Diagnosis not present

## 2018-02-04 DIAGNOSIS — R569 Unspecified convulsions: Secondary | ICD-10-CM | POA: Diagnosis not present

## 2018-02-05 DIAGNOSIS — I69315 Cognitive social or emotional deficit following cerebral infarction: Secondary | ICD-10-CM | POA: Diagnosis not present

## 2018-02-05 DIAGNOSIS — R569 Unspecified convulsions: Secondary | ICD-10-CM | POA: Diagnosis not present

## 2018-02-06 DIAGNOSIS — R569 Unspecified convulsions: Secondary | ICD-10-CM | POA: Diagnosis not present

## 2018-02-06 DIAGNOSIS — I69315 Cognitive social or emotional deficit following cerebral infarction: Secondary | ICD-10-CM | POA: Diagnosis not present

## 2018-02-07 DIAGNOSIS — R569 Unspecified convulsions: Secondary | ICD-10-CM | POA: Diagnosis not present

## 2018-02-07 DIAGNOSIS — I69315 Cognitive social or emotional deficit following cerebral infarction: Secondary | ICD-10-CM | POA: Diagnosis not present

## 2018-02-08 DIAGNOSIS — R569 Unspecified convulsions: Secondary | ICD-10-CM | POA: Diagnosis not present

## 2018-02-08 DIAGNOSIS — I69315 Cognitive social or emotional deficit following cerebral infarction: Secondary | ICD-10-CM | POA: Diagnosis not present

## 2018-02-09 DIAGNOSIS — R569 Unspecified convulsions: Secondary | ICD-10-CM | POA: Diagnosis not present

## 2018-02-09 DIAGNOSIS — I69315 Cognitive social or emotional deficit following cerebral infarction: Secondary | ICD-10-CM | POA: Diagnosis not present

## 2018-02-10 DIAGNOSIS — I69315 Cognitive social or emotional deficit following cerebral infarction: Secondary | ICD-10-CM | POA: Diagnosis not present

## 2018-02-10 DIAGNOSIS — R569 Unspecified convulsions: Secondary | ICD-10-CM | POA: Diagnosis not present

## 2018-02-11 DIAGNOSIS — I69315 Cognitive social or emotional deficit following cerebral infarction: Secondary | ICD-10-CM | POA: Diagnosis not present

## 2018-02-11 DIAGNOSIS — R569 Unspecified convulsions: Secondary | ICD-10-CM | POA: Diagnosis not present

## 2018-02-12 DIAGNOSIS — R569 Unspecified convulsions: Secondary | ICD-10-CM | POA: Diagnosis not present

## 2018-02-12 DIAGNOSIS — I69315 Cognitive social or emotional deficit following cerebral infarction: Secondary | ICD-10-CM | POA: Diagnosis not present

## 2018-02-13 DIAGNOSIS — I69315 Cognitive social or emotional deficit following cerebral infarction: Secondary | ICD-10-CM | POA: Diagnosis not present

## 2018-02-13 DIAGNOSIS — R569 Unspecified convulsions: Secondary | ICD-10-CM | POA: Diagnosis not present

## 2018-02-14 DIAGNOSIS — R569 Unspecified convulsions: Secondary | ICD-10-CM | POA: Diagnosis not present

## 2018-02-14 DIAGNOSIS — I69315 Cognitive social or emotional deficit following cerebral infarction: Secondary | ICD-10-CM | POA: Diagnosis not present

## 2018-02-15 DIAGNOSIS — I69315 Cognitive social or emotional deficit following cerebral infarction: Secondary | ICD-10-CM | POA: Diagnosis not present

## 2018-02-15 DIAGNOSIS — R569 Unspecified convulsions: Secondary | ICD-10-CM | POA: Diagnosis not present

## 2018-02-16 DIAGNOSIS — F17200 Nicotine dependence, unspecified, uncomplicated: Secondary | ICD-10-CM | POA: Diagnosis not present

## 2018-02-16 DIAGNOSIS — Z91013 Allergy to seafood: Secondary | ICD-10-CM | POA: Diagnosis not present

## 2018-02-16 DIAGNOSIS — M545 Low back pain: Secondary | ICD-10-CM | POA: Diagnosis not present

## 2018-02-16 DIAGNOSIS — M62838 Other muscle spasm: Secondary | ICD-10-CM | POA: Diagnosis not present

## 2018-02-16 DIAGNOSIS — S39012A Strain of muscle, fascia and tendon of lower back, initial encounter: Secondary | ICD-10-CM | POA: Diagnosis not present

## 2018-02-16 DIAGNOSIS — Y999 Unspecified external cause status: Secondary | ICD-10-CM | POA: Diagnosis not present

## 2018-02-16 DIAGNOSIS — I69315 Cognitive social or emotional deficit following cerebral infarction: Secondary | ICD-10-CM | POA: Diagnosis not present

## 2018-02-16 DIAGNOSIS — F419 Anxiety disorder, unspecified: Secondary | ICD-10-CM | POA: Diagnosis not present

## 2018-02-16 DIAGNOSIS — R569 Unspecified convulsions: Secondary | ICD-10-CM | POA: Diagnosis not present

## 2018-02-16 DIAGNOSIS — I10 Essential (primary) hypertension: Secondary | ICD-10-CM | POA: Diagnosis not present

## 2018-02-16 DIAGNOSIS — K56609 Unspecified intestinal obstruction, unspecified as to partial versus complete obstruction: Secondary | ICD-10-CM | POA: Diagnosis not present

## 2018-02-16 DIAGNOSIS — M62830 Muscle spasm of back: Secondary | ICD-10-CM | POA: Diagnosis not present

## 2018-02-16 DIAGNOSIS — Z882 Allergy status to sulfonamides status: Secondary | ICD-10-CM | POA: Diagnosis not present

## 2018-02-18 DIAGNOSIS — I69315 Cognitive social or emotional deficit following cerebral infarction: Secondary | ICD-10-CM | POA: Diagnosis not present

## 2018-02-18 DIAGNOSIS — R569 Unspecified convulsions: Secondary | ICD-10-CM | POA: Diagnosis not present

## 2018-02-19 DIAGNOSIS — I69315 Cognitive social or emotional deficit following cerebral infarction: Secondary | ICD-10-CM | POA: Diagnosis not present

## 2018-02-19 DIAGNOSIS — R569 Unspecified convulsions: Secondary | ICD-10-CM | POA: Diagnosis not present

## 2018-02-20 DIAGNOSIS — I69315 Cognitive social or emotional deficit following cerebral infarction: Secondary | ICD-10-CM | POA: Diagnosis not present

## 2018-02-20 DIAGNOSIS — R569 Unspecified convulsions: Secondary | ICD-10-CM | POA: Diagnosis not present

## 2018-02-21 DIAGNOSIS — I69315 Cognitive social or emotional deficit following cerebral infarction: Secondary | ICD-10-CM | POA: Diagnosis not present

## 2018-02-21 DIAGNOSIS — R569 Unspecified convulsions: Secondary | ICD-10-CM | POA: Diagnosis not present

## 2018-02-22 DIAGNOSIS — R569 Unspecified convulsions: Secondary | ICD-10-CM | POA: Diagnosis not present

## 2018-02-22 DIAGNOSIS — I69315 Cognitive social or emotional deficit following cerebral infarction: Secondary | ICD-10-CM | POA: Diagnosis not present

## 2018-02-23 DIAGNOSIS — R569 Unspecified convulsions: Secondary | ICD-10-CM | POA: Diagnosis not present

## 2018-02-23 DIAGNOSIS — I69315 Cognitive social or emotional deficit following cerebral infarction: Secondary | ICD-10-CM | POA: Diagnosis not present

## 2018-02-24 DIAGNOSIS — R569 Unspecified convulsions: Secondary | ICD-10-CM | POA: Diagnosis not present

## 2018-02-24 DIAGNOSIS — I69315 Cognitive social or emotional deficit following cerebral infarction: Secondary | ICD-10-CM | POA: Diagnosis not present

## 2018-02-25 DIAGNOSIS — I69315 Cognitive social or emotional deficit following cerebral infarction: Secondary | ICD-10-CM | POA: Diagnosis not present

## 2018-02-25 DIAGNOSIS — R569 Unspecified convulsions: Secondary | ICD-10-CM | POA: Diagnosis not present

## 2018-02-26 DIAGNOSIS — I69315 Cognitive social or emotional deficit following cerebral infarction: Secondary | ICD-10-CM | POA: Diagnosis not present

## 2018-02-26 DIAGNOSIS — R569 Unspecified convulsions: Secondary | ICD-10-CM | POA: Diagnosis not present

## 2018-02-27 DIAGNOSIS — R569 Unspecified convulsions: Secondary | ICD-10-CM | POA: Diagnosis not present

## 2018-02-27 DIAGNOSIS — I69315 Cognitive social or emotional deficit following cerebral infarction: Secondary | ICD-10-CM | POA: Diagnosis not present

## 2018-02-28 DIAGNOSIS — R569 Unspecified convulsions: Secondary | ICD-10-CM | POA: Diagnosis not present

## 2018-02-28 DIAGNOSIS — I69315 Cognitive social or emotional deficit following cerebral infarction: Secondary | ICD-10-CM | POA: Diagnosis not present

## 2018-03-01 DIAGNOSIS — I69315 Cognitive social or emotional deficit following cerebral infarction: Secondary | ICD-10-CM | POA: Diagnosis not present

## 2018-03-01 DIAGNOSIS — R569 Unspecified convulsions: Secondary | ICD-10-CM | POA: Diagnosis not present

## 2018-03-02 DIAGNOSIS — I69315 Cognitive social or emotional deficit following cerebral infarction: Secondary | ICD-10-CM | POA: Diagnosis not present

## 2018-03-02 DIAGNOSIS — R569 Unspecified convulsions: Secondary | ICD-10-CM | POA: Diagnosis not present

## 2018-03-03 DIAGNOSIS — R569 Unspecified convulsions: Secondary | ICD-10-CM | POA: Diagnosis not present

## 2018-03-03 DIAGNOSIS — I69315 Cognitive social or emotional deficit following cerebral infarction: Secondary | ICD-10-CM | POA: Diagnosis not present

## 2018-03-04 DIAGNOSIS — R569 Unspecified convulsions: Secondary | ICD-10-CM | POA: Diagnosis not present

## 2018-03-04 DIAGNOSIS — R32 Unspecified urinary incontinence: Secondary | ICD-10-CM | POA: Diagnosis not present

## 2018-03-04 DIAGNOSIS — I69315 Cognitive social or emotional deficit following cerebral infarction: Secondary | ICD-10-CM | POA: Diagnosis not present

## 2018-03-05 DIAGNOSIS — R569 Unspecified convulsions: Secondary | ICD-10-CM | POA: Diagnosis not present

## 2018-03-05 DIAGNOSIS — I69315 Cognitive social or emotional deficit following cerebral infarction: Secondary | ICD-10-CM | POA: Diagnosis not present

## 2018-03-06 DIAGNOSIS — I69315 Cognitive social or emotional deficit following cerebral infarction: Secondary | ICD-10-CM | POA: Diagnosis not present

## 2018-03-06 DIAGNOSIS — R569 Unspecified convulsions: Secondary | ICD-10-CM | POA: Diagnosis not present

## 2018-03-07 DIAGNOSIS — R569 Unspecified convulsions: Secondary | ICD-10-CM | POA: Diagnosis not present

## 2018-03-07 DIAGNOSIS — I69315 Cognitive social or emotional deficit following cerebral infarction: Secondary | ICD-10-CM | POA: Diagnosis not present

## 2018-03-08 DIAGNOSIS — R569 Unspecified convulsions: Secondary | ICD-10-CM | POA: Diagnosis not present

## 2018-03-08 DIAGNOSIS — I69315 Cognitive social or emotional deficit following cerebral infarction: Secondary | ICD-10-CM | POA: Diagnosis not present

## 2018-03-09 DIAGNOSIS — R569 Unspecified convulsions: Secondary | ICD-10-CM | POA: Diagnosis not present

## 2018-03-09 DIAGNOSIS — I69315 Cognitive social or emotional deficit following cerebral infarction: Secondary | ICD-10-CM | POA: Diagnosis not present

## 2018-03-10 DIAGNOSIS — R569 Unspecified convulsions: Secondary | ICD-10-CM | POA: Diagnosis not present

## 2018-03-10 DIAGNOSIS — I69315 Cognitive social or emotional deficit following cerebral infarction: Secondary | ICD-10-CM | POA: Diagnosis not present

## 2018-03-11 DIAGNOSIS — R569 Unspecified convulsions: Secondary | ICD-10-CM | POA: Diagnosis not present

## 2018-03-11 DIAGNOSIS — I69315 Cognitive social or emotional deficit following cerebral infarction: Secondary | ICD-10-CM | POA: Diagnosis not present

## 2018-03-12 DIAGNOSIS — R569 Unspecified convulsions: Secondary | ICD-10-CM | POA: Diagnosis not present

## 2018-03-12 DIAGNOSIS — I69315 Cognitive social or emotional deficit following cerebral infarction: Secondary | ICD-10-CM | POA: Diagnosis not present

## 2018-03-13 DIAGNOSIS — I69315 Cognitive social or emotional deficit following cerebral infarction: Secondary | ICD-10-CM | POA: Diagnosis not present

## 2018-03-13 DIAGNOSIS — R569 Unspecified convulsions: Secondary | ICD-10-CM | POA: Diagnosis not present

## 2018-03-14 DIAGNOSIS — I69315 Cognitive social or emotional deficit following cerebral infarction: Secondary | ICD-10-CM | POA: Diagnosis not present

## 2018-03-14 DIAGNOSIS — R569 Unspecified convulsions: Secondary | ICD-10-CM | POA: Diagnosis not present

## 2018-03-15 DIAGNOSIS — I69315 Cognitive social or emotional deficit following cerebral infarction: Secondary | ICD-10-CM | POA: Diagnosis not present

## 2018-03-15 DIAGNOSIS — R569 Unspecified convulsions: Secondary | ICD-10-CM | POA: Diagnosis not present

## 2018-03-16 DIAGNOSIS — R569 Unspecified convulsions: Secondary | ICD-10-CM | POA: Diagnosis not present

## 2018-03-16 DIAGNOSIS — I69315 Cognitive social or emotional deficit following cerebral infarction: Secondary | ICD-10-CM | POA: Diagnosis not present

## 2018-03-17 DIAGNOSIS — R569 Unspecified convulsions: Secondary | ICD-10-CM | POA: Diagnosis not present

## 2018-03-17 DIAGNOSIS — I69315 Cognitive social or emotional deficit following cerebral infarction: Secondary | ICD-10-CM | POA: Diagnosis not present

## 2018-03-18 DIAGNOSIS — R569 Unspecified convulsions: Secondary | ICD-10-CM | POA: Diagnosis not present

## 2018-03-18 DIAGNOSIS — I69315 Cognitive social or emotional deficit following cerebral infarction: Secondary | ICD-10-CM | POA: Diagnosis not present

## 2018-03-19 DIAGNOSIS — R569 Unspecified convulsions: Secondary | ICD-10-CM | POA: Diagnosis not present

## 2018-03-19 DIAGNOSIS — I69315 Cognitive social or emotional deficit following cerebral infarction: Secondary | ICD-10-CM | POA: Diagnosis not present

## 2018-03-20 DIAGNOSIS — I69315 Cognitive social or emotional deficit following cerebral infarction: Secondary | ICD-10-CM | POA: Diagnosis not present

## 2018-03-20 DIAGNOSIS — R569 Unspecified convulsions: Secondary | ICD-10-CM | POA: Diagnosis not present

## 2018-03-21 DIAGNOSIS — R569 Unspecified convulsions: Secondary | ICD-10-CM | POA: Diagnosis not present

## 2018-03-21 DIAGNOSIS — I69315 Cognitive social or emotional deficit following cerebral infarction: Secondary | ICD-10-CM | POA: Diagnosis not present

## 2018-03-22 DIAGNOSIS — I69315 Cognitive social or emotional deficit following cerebral infarction: Secondary | ICD-10-CM | POA: Diagnosis not present

## 2018-03-22 DIAGNOSIS — R569 Unspecified convulsions: Secondary | ICD-10-CM | POA: Diagnosis not present

## 2018-03-23 DIAGNOSIS — I69315 Cognitive social or emotional deficit following cerebral infarction: Secondary | ICD-10-CM | POA: Diagnosis not present

## 2018-03-23 DIAGNOSIS — R569 Unspecified convulsions: Secondary | ICD-10-CM | POA: Diagnosis not present

## 2018-03-24 DIAGNOSIS — R569 Unspecified convulsions: Secondary | ICD-10-CM | POA: Diagnosis not present

## 2018-03-24 DIAGNOSIS — I69315 Cognitive social or emotional deficit following cerebral infarction: Secondary | ICD-10-CM | POA: Diagnosis not present

## 2018-03-25 DIAGNOSIS — I69315 Cognitive social or emotional deficit following cerebral infarction: Secondary | ICD-10-CM | POA: Diagnosis not present

## 2018-03-25 DIAGNOSIS — R569 Unspecified convulsions: Secondary | ICD-10-CM | POA: Diagnosis not present

## 2018-03-26 DIAGNOSIS — R569 Unspecified convulsions: Secondary | ICD-10-CM | POA: Diagnosis not present

## 2018-03-26 DIAGNOSIS — I69315 Cognitive social or emotional deficit following cerebral infarction: Secondary | ICD-10-CM | POA: Diagnosis not present

## 2018-03-27 DIAGNOSIS — I69315 Cognitive social or emotional deficit following cerebral infarction: Secondary | ICD-10-CM | POA: Diagnosis not present

## 2018-03-27 DIAGNOSIS — R569 Unspecified convulsions: Secondary | ICD-10-CM | POA: Diagnosis not present

## 2018-03-28 DIAGNOSIS — I69315 Cognitive social or emotional deficit following cerebral infarction: Secondary | ICD-10-CM | POA: Diagnosis not present

## 2018-03-28 DIAGNOSIS — R569 Unspecified convulsions: Secondary | ICD-10-CM | POA: Diagnosis not present

## 2018-03-29 DIAGNOSIS — R569 Unspecified convulsions: Secondary | ICD-10-CM | POA: Diagnosis not present

## 2018-03-29 DIAGNOSIS — I69315 Cognitive social or emotional deficit following cerebral infarction: Secondary | ICD-10-CM | POA: Diagnosis not present

## 2018-04-03 DIAGNOSIS — I69315 Cognitive social or emotional deficit following cerebral infarction: Secondary | ICD-10-CM | POA: Diagnosis not present

## 2018-04-03 DIAGNOSIS — R569 Unspecified convulsions: Secondary | ICD-10-CM | POA: Diagnosis not present

## 2018-04-04 DIAGNOSIS — I69315 Cognitive social or emotional deficit following cerebral infarction: Secondary | ICD-10-CM | POA: Diagnosis not present

## 2018-04-04 DIAGNOSIS — R569 Unspecified convulsions: Secondary | ICD-10-CM | POA: Diagnosis not present

## 2018-04-05 DIAGNOSIS — R569 Unspecified convulsions: Secondary | ICD-10-CM | POA: Diagnosis not present

## 2018-04-05 DIAGNOSIS — I69315 Cognitive social or emotional deficit following cerebral infarction: Secondary | ICD-10-CM | POA: Diagnosis not present

## 2018-04-06 DIAGNOSIS — I69315 Cognitive social or emotional deficit following cerebral infarction: Secondary | ICD-10-CM | POA: Diagnosis not present

## 2018-04-06 DIAGNOSIS — R569 Unspecified convulsions: Secondary | ICD-10-CM | POA: Diagnosis not present

## 2018-04-07 DIAGNOSIS — R569 Unspecified convulsions: Secondary | ICD-10-CM | POA: Diagnosis not present

## 2018-04-07 DIAGNOSIS — I69315 Cognitive social or emotional deficit following cerebral infarction: Secondary | ICD-10-CM | POA: Diagnosis not present

## 2018-04-08 DIAGNOSIS — I69315 Cognitive social or emotional deficit following cerebral infarction: Secondary | ICD-10-CM | POA: Diagnosis not present

## 2018-04-08 DIAGNOSIS — R569 Unspecified convulsions: Secondary | ICD-10-CM | POA: Diagnosis not present

## 2018-04-09 DIAGNOSIS — R569 Unspecified convulsions: Secondary | ICD-10-CM | POA: Diagnosis not present

## 2018-04-09 DIAGNOSIS — R32 Unspecified urinary incontinence: Secondary | ICD-10-CM | POA: Diagnosis not present

## 2018-04-09 DIAGNOSIS — I69315 Cognitive social or emotional deficit following cerebral infarction: Secondary | ICD-10-CM | POA: Diagnosis not present

## 2018-04-10 DIAGNOSIS — I69315 Cognitive social or emotional deficit following cerebral infarction: Secondary | ICD-10-CM | POA: Diagnosis not present

## 2018-04-10 DIAGNOSIS — R569 Unspecified convulsions: Secondary | ICD-10-CM | POA: Diagnosis not present

## 2018-04-11 DIAGNOSIS — R569 Unspecified convulsions: Secondary | ICD-10-CM | POA: Diagnosis not present

## 2018-04-11 DIAGNOSIS — I69315 Cognitive social or emotional deficit following cerebral infarction: Secondary | ICD-10-CM | POA: Diagnosis not present

## 2018-04-12 DIAGNOSIS — R569 Unspecified convulsions: Secondary | ICD-10-CM | POA: Diagnosis not present

## 2018-04-12 DIAGNOSIS — I69315 Cognitive social or emotional deficit following cerebral infarction: Secondary | ICD-10-CM | POA: Diagnosis not present

## 2018-04-13 DIAGNOSIS — I69315 Cognitive social or emotional deficit following cerebral infarction: Secondary | ICD-10-CM | POA: Diagnosis not present

## 2018-04-13 DIAGNOSIS — R569 Unspecified convulsions: Secondary | ICD-10-CM | POA: Diagnosis not present

## 2018-04-14 DIAGNOSIS — R569 Unspecified convulsions: Secondary | ICD-10-CM | POA: Diagnosis not present

## 2018-04-14 DIAGNOSIS — I69315 Cognitive social or emotional deficit following cerebral infarction: Secondary | ICD-10-CM | POA: Diagnosis not present

## 2018-04-15 DIAGNOSIS — R569 Unspecified convulsions: Secondary | ICD-10-CM | POA: Diagnosis not present

## 2018-04-15 DIAGNOSIS — I69315 Cognitive social or emotional deficit following cerebral infarction: Secondary | ICD-10-CM | POA: Diagnosis not present

## 2018-04-16 DIAGNOSIS — I69315 Cognitive social or emotional deficit following cerebral infarction: Secondary | ICD-10-CM | POA: Diagnosis not present

## 2018-04-16 DIAGNOSIS — R569 Unspecified convulsions: Secondary | ICD-10-CM | POA: Diagnosis not present

## 2018-04-17 DIAGNOSIS — R569 Unspecified convulsions: Secondary | ICD-10-CM | POA: Diagnosis not present

## 2018-04-17 DIAGNOSIS — I69315 Cognitive social or emotional deficit following cerebral infarction: Secondary | ICD-10-CM | POA: Diagnosis not present

## 2018-04-18 DIAGNOSIS — I69315 Cognitive social or emotional deficit following cerebral infarction: Secondary | ICD-10-CM | POA: Diagnosis not present

## 2018-04-18 DIAGNOSIS — R569 Unspecified convulsions: Secondary | ICD-10-CM | POA: Diagnosis not present

## 2018-04-19 DIAGNOSIS — R569 Unspecified convulsions: Secondary | ICD-10-CM | POA: Diagnosis not present

## 2018-04-19 DIAGNOSIS — I69315 Cognitive social or emotional deficit following cerebral infarction: Secondary | ICD-10-CM | POA: Diagnosis not present

## 2018-04-20 DIAGNOSIS — R569 Unspecified convulsions: Secondary | ICD-10-CM | POA: Diagnosis not present

## 2018-04-20 DIAGNOSIS — I69315 Cognitive social or emotional deficit following cerebral infarction: Secondary | ICD-10-CM | POA: Diagnosis not present

## 2018-04-21 DIAGNOSIS — R569 Unspecified convulsions: Secondary | ICD-10-CM | POA: Diagnosis not present

## 2018-04-21 DIAGNOSIS — I69315 Cognitive social or emotional deficit following cerebral infarction: Secondary | ICD-10-CM | POA: Diagnosis not present

## 2018-04-22 DIAGNOSIS — R569 Unspecified convulsions: Secondary | ICD-10-CM | POA: Diagnosis not present

## 2018-04-22 DIAGNOSIS — I69315 Cognitive social or emotional deficit following cerebral infarction: Secondary | ICD-10-CM | POA: Diagnosis not present

## 2018-04-23 DIAGNOSIS — R569 Unspecified convulsions: Secondary | ICD-10-CM | POA: Diagnosis not present

## 2018-04-23 DIAGNOSIS — I69315 Cognitive social or emotional deficit following cerebral infarction: Secondary | ICD-10-CM | POA: Diagnosis not present

## 2018-04-24 DIAGNOSIS — R569 Unspecified convulsions: Secondary | ICD-10-CM | POA: Diagnosis not present

## 2018-04-24 DIAGNOSIS — I69315 Cognitive social or emotional deficit following cerebral infarction: Secondary | ICD-10-CM | POA: Diagnosis not present

## 2018-04-25 DIAGNOSIS — I69315 Cognitive social or emotional deficit following cerebral infarction: Secondary | ICD-10-CM | POA: Diagnosis not present

## 2018-04-25 DIAGNOSIS — R569 Unspecified convulsions: Secondary | ICD-10-CM | POA: Diagnosis not present

## 2018-04-26 DIAGNOSIS — I69315 Cognitive social or emotional deficit following cerebral infarction: Secondary | ICD-10-CM | POA: Diagnosis not present

## 2018-04-26 DIAGNOSIS — R569 Unspecified convulsions: Secondary | ICD-10-CM | POA: Diagnosis not present

## 2018-04-27 DIAGNOSIS — I69315 Cognitive social or emotional deficit following cerebral infarction: Secondary | ICD-10-CM | POA: Diagnosis not present

## 2018-04-27 DIAGNOSIS — R569 Unspecified convulsions: Secondary | ICD-10-CM | POA: Diagnosis not present

## 2018-04-28 DIAGNOSIS — I69315 Cognitive social or emotional deficit following cerebral infarction: Secondary | ICD-10-CM | POA: Diagnosis not present

## 2018-04-28 DIAGNOSIS — R569 Unspecified convulsions: Secondary | ICD-10-CM | POA: Diagnosis not present

## 2018-04-29 DIAGNOSIS — R569 Unspecified convulsions: Secondary | ICD-10-CM | POA: Diagnosis not present

## 2018-04-29 DIAGNOSIS — I69315 Cognitive social or emotional deficit following cerebral infarction: Secondary | ICD-10-CM | POA: Diagnosis not present

## 2018-04-30 DIAGNOSIS — I69315 Cognitive social or emotional deficit following cerebral infarction: Secondary | ICD-10-CM | POA: Diagnosis not present

## 2018-04-30 DIAGNOSIS — R569 Unspecified convulsions: Secondary | ICD-10-CM | POA: Diagnosis not present

## 2018-05-01 DIAGNOSIS — R569 Unspecified convulsions: Secondary | ICD-10-CM | POA: Diagnosis not present

## 2018-05-01 DIAGNOSIS — I69315 Cognitive social or emotional deficit following cerebral infarction: Secondary | ICD-10-CM | POA: Diagnosis not present

## 2018-05-02 DIAGNOSIS — I69315 Cognitive social or emotional deficit following cerebral infarction: Secondary | ICD-10-CM | POA: Diagnosis not present

## 2018-05-02 DIAGNOSIS — R569 Unspecified convulsions: Secondary | ICD-10-CM | POA: Diagnosis not present

## 2018-05-03 DIAGNOSIS — R569 Unspecified convulsions: Secondary | ICD-10-CM | POA: Diagnosis not present

## 2018-05-03 DIAGNOSIS — I69315 Cognitive social or emotional deficit following cerebral infarction: Secondary | ICD-10-CM | POA: Diagnosis not present

## 2018-05-04 DIAGNOSIS — I69315 Cognitive social or emotional deficit following cerebral infarction: Secondary | ICD-10-CM | POA: Diagnosis not present

## 2018-05-04 DIAGNOSIS — R569 Unspecified convulsions: Secondary | ICD-10-CM | POA: Diagnosis not present

## 2018-05-05 DIAGNOSIS — I69315 Cognitive social or emotional deficit following cerebral infarction: Secondary | ICD-10-CM | POA: Diagnosis not present

## 2018-05-05 DIAGNOSIS — R569 Unspecified convulsions: Secondary | ICD-10-CM | POA: Diagnosis not present

## 2018-05-06 DIAGNOSIS — I69315 Cognitive social or emotional deficit following cerebral infarction: Secondary | ICD-10-CM | POA: Diagnosis not present

## 2018-05-06 DIAGNOSIS — R569 Unspecified convulsions: Secondary | ICD-10-CM | POA: Diagnosis not present

## 2018-05-07 DIAGNOSIS — I69315 Cognitive social or emotional deficit following cerebral infarction: Secondary | ICD-10-CM | POA: Diagnosis not present

## 2018-05-07 DIAGNOSIS — R569 Unspecified convulsions: Secondary | ICD-10-CM | POA: Diagnosis not present

## 2018-05-08 DIAGNOSIS — R569 Unspecified convulsions: Secondary | ICD-10-CM | POA: Diagnosis not present

## 2018-05-08 DIAGNOSIS — I69315 Cognitive social or emotional deficit following cerebral infarction: Secondary | ICD-10-CM | POA: Diagnosis not present

## 2018-05-09 DIAGNOSIS — R569 Unspecified convulsions: Secondary | ICD-10-CM | POA: Diagnosis not present

## 2018-05-09 DIAGNOSIS — I69315 Cognitive social or emotional deficit following cerebral infarction: Secondary | ICD-10-CM | POA: Diagnosis not present

## 2018-05-10 DIAGNOSIS — R569 Unspecified convulsions: Secondary | ICD-10-CM | POA: Diagnosis not present

## 2018-05-10 DIAGNOSIS — I69315 Cognitive social or emotional deficit following cerebral infarction: Secondary | ICD-10-CM | POA: Diagnosis not present

## 2018-05-11 DIAGNOSIS — R569 Unspecified convulsions: Secondary | ICD-10-CM | POA: Diagnosis not present

## 2018-05-11 DIAGNOSIS — I69315 Cognitive social or emotional deficit following cerebral infarction: Secondary | ICD-10-CM | POA: Diagnosis not present

## 2018-05-12 DIAGNOSIS — R569 Unspecified convulsions: Secondary | ICD-10-CM | POA: Diagnosis not present

## 2018-05-12 DIAGNOSIS — I69315 Cognitive social or emotional deficit following cerebral infarction: Secondary | ICD-10-CM | POA: Diagnosis not present

## 2018-05-13 DIAGNOSIS — I69315 Cognitive social or emotional deficit following cerebral infarction: Secondary | ICD-10-CM | POA: Diagnosis not present

## 2018-05-13 DIAGNOSIS — R569 Unspecified convulsions: Secondary | ICD-10-CM | POA: Diagnosis not present

## 2018-05-14 DIAGNOSIS — I69315 Cognitive social or emotional deficit following cerebral infarction: Secondary | ICD-10-CM | POA: Diagnosis not present

## 2018-05-14 DIAGNOSIS — R569 Unspecified convulsions: Secondary | ICD-10-CM | POA: Diagnosis not present

## 2018-05-15 DIAGNOSIS — R569 Unspecified convulsions: Secondary | ICD-10-CM | POA: Diagnosis not present

## 2018-05-15 DIAGNOSIS — I69315 Cognitive social or emotional deficit following cerebral infarction: Secondary | ICD-10-CM | POA: Diagnosis not present

## 2018-05-16 DIAGNOSIS — R569 Unspecified convulsions: Secondary | ICD-10-CM | POA: Diagnosis not present

## 2018-05-16 DIAGNOSIS — I69315 Cognitive social or emotional deficit following cerebral infarction: Secondary | ICD-10-CM | POA: Diagnosis not present

## 2018-05-17 DIAGNOSIS — I69315 Cognitive social or emotional deficit following cerebral infarction: Secondary | ICD-10-CM | POA: Diagnosis not present

## 2018-05-17 DIAGNOSIS — R569 Unspecified convulsions: Secondary | ICD-10-CM | POA: Diagnosis not present

## 2018-05-18 DIAGNOSIS — I69315 Cognitive social or emotional deficit following cerebral infarction: Secondary | ICD-10-CM | POA: Diagnosis not present

## 2018-05-18 DIAGNOSIS — R569 Unspecified convulsions: Secondary | ICD-10-CM | POA: Diagnosis not present

## 2018-05-19 DIAGNOSIS — R569 Unspecified convulsions: Secondary | ICD-10-CM | POA: Diagnosis not present

## 2018-05-19 DIAGNOSIS — I69315 Cognitive social or emotional deficit following cerebral infarction: Secondary | ICD-10-CM | POA: Diagnosis not present

## 2018-05-20 DIAGNOSIS — I69315 Cognitive social or emotional deficit following cerebral infarction: Secondary | ICD-10-CM | POA: Diagnosis not present

## 2018-05-20 DIAGNOSIS — R569 Unspecified convulsions: Secondary | ICD-10-CM | POA: Diagnosis not present

## 2018-05-21 DIAGNOSIS — R569 Unspecified convulsions: Secondary | ICD-10-CM | POA: Diagnosis not present

## 2018-05-21 DIAGNOSIS — I69315 Cognitive social or emotional deficit following cerebral infarction: Secondary | ICD-10-CM | POA: Diagnosis not present

## 2018-05-22 DIAGNOSIS — I69315 Cognitive social or emotional deficit following cerebral infarction: Secondary | ICD-10-CM | POA: Diagnosis not present

## 2018-05-22 DIAGNOSIS — R569 Unspecified convulsions: Secondary | ICD-10-CM | POA: Diagnosis not present

## 2018-05-23 DIAGNOSIS — I69315 Cognitive social or emotional deficit following cerebral infarction: Secondary | ICD-10-CM | POA: Diagnosis not present

## 2018-05-23 DIAGNOSIS — R569 Unspecified convulsions: Secondary | ICD-10-CM | POA: Diagnosis not present

## 2018-05-24 DIAGNOSIS — I69315 Cognitive social or emotional deficit following cerebral infarction: Secondary | ICD-10-CM | POA: Diagnosis not present

## 2018-05-24 DIAGNOSIS — R569 Unspecified convulsions: Secondary | ICD-10-CM | POA: Diagnosis not present

## 2018-05-25 DIAGNOSIS — R569 Unspecified convulsions: Secondary | ICD-10-CM | POA: Diagnosis not present

## 2018-05-25 DIAGNOSIS — I69315 Cognitive social or emotional deficit following cerebral infarction: Secondary | ICD-10-CM | POA: Diagnosis not present

## 2018-05-26 DIAGNOSIS — I69315 Cognitive social or emotional deficit following cerebral infarction: Secondary | ICD-10-CM | POA: Diagnosis not present

## 2018-05-26 DIAGNOSIS — R569 Unspecified convulsions: Secondary | ICD-10-CM | POA: Diagnosis not present

## 2018-05-27 DIAGNOSIS — R569 Unspecified convulsions: Secondary | ICD-10-CM | POA: Diagnosis not present

## 2018-05-27 DIAGNOSIS — I69315 Cognitive social or emotional deficit following cerebral infarction: Secondary | ICD-10-CM | POA: Diagnosis not present

## 2018-05-28 DIAGNOSIS — R569 Unspecified convulsions: Secondary | ICD-10-CM | POA: Diagnosis not present

## 2018-05-28 DIAGNOSIS — I69315 Cognitive social or emotional deficit following cerebral infarction: Secondary | ICD-10-CM | POA: Diagnosis not present

## 2018-05-29 DIAGNOSIS — I69315 Cognitive social or emotional deficit following cerebral infarction: Secondary | ICD-10-CM | POA: Diagnosis not present

## 2018-05-29 DIAGNOSIS — R569 Unspecified convulsions: Secondary | ICD-10-CM | POA: Diagnosis not present

## 2018-05-31 DIAGNOSIS — I69315 Cognitive social or emotional deficit following cerebral infarction: Secondary | ICD-10-CM | POA: Diagnosis not present

## 2018-05-31 DIAGNOSIS — R569 Unspecified convulsions: Secondary | ICD-10-CM | POA: Diagnosis not present

## 2018-06-01 DIAGNOSIS — R569 Unspecified convulsions: Secondary | ICD-10-CM | POA: Diagnosis not present

## 2018-06-01 DIAGNOSIS — I69315 Cognitive social or emotional deficit following cerebral infarction: Secondary | ICD-10-CM | POA: Diagnosis not present

## 2018-06-02 DIAGNOSIS — I69315 Cognitive social or emotional deficit following cerebral infarction: Secondary | ICD-10-CM | POA: Diagnosis not present

## 2018-06-02 DIAGNOSIS — R569 Unspecified convulsions: Secondary | ICD-10-CM | POA: Diagnosis not present

## 2018-06-03 DIAGNOSIS — R569 Unspecified convulsions: Secondary | ICD-10-CM | POA: Diagnosis not present

## 2018-06-03 DIAGNOSIS — I69315 Cognitive social or emotional deficit following cerebral infarction: Secondary | ICD-10-CM | POA: Diagnosis not present

## 2018-06-04 DIAGNOSIS — I69315 Cognitive social or emotional deficit following cerebral infarction: Secondary | ICD-10-CM | POA: Diagnosis not present

## 2018-06-04 DIAGNOSIS — R569 Unspecified convulsions: Secondary | ICD-10-CM | POA: Diagnosis not present

## 2018-06-05 DIAGNOSIS — R569 Unspecified convulsions: Secondary | ICD-10-CM | POA: Diagnosis not present

## 2018-06-05 DIAGNOSIS — I69315 Cognitive social or emotional deficit following cerebral infarction: Secondary | ICD-10-CM | POA: Diagnosis not present

## 2018-06-06 DIAGNOSIS — R569 Unspecified convulsions: Secondary | ICD-10-CM | POA: Diagnosis not present

## 2018-06-06 DIAGNOSIS — I69315 Cognitive social or emotional deficit following cerebral infarction: Secondary | ICD-10-CM | POA: Diagnosis not present

## 2018-06-07 DIAGNOSIS — I69315 Cognitive social or emotional deficit following cerebral infarction: Secondary | ICD-10-CM | POA: Diagnosis not present

## 2018-06-07 DIAGNOSIS — R569 Unspecified convulsions: Secondary | ICD-10-CM | POA: Diagnosis not present

## 2018-06-08 DIAGNOSIS — I69315 Cognitive social or emotional deficit following cerebral infarction: Secondary | ICD-10-CM | POA: Diagnosis not present

## 2018-06-08 DIAGNOSIS — R569 Unspecified convulsions: Secondary | ICD-10-CM | POA: Diagnosis not present

## 2018-06-09 DIAGNOSIS — R569 Unspecified convulsions: Secondary | ICD-10-CM | POA: Diagnosis not present

## 2018-06-09 DIAGNOSIS — I69315 Cognitive social or emotional deficit following cerebral infarction: Secondary | ICD-10-CM | POA: Diagnosis not present

## 2018-06-10 DIAGNOSIS — R569 Unspecified convulsions: Secondary | ICD-10-CM | POA: Diagnosis not present

## 2018-06-10 DIAGNOSIS — I69315 Cognitive social or emotional deficit following cerebral infarction: Secondary | ICD-10-CM | POA: Diagnosis not present

## 2018-06-11 DIAGNOSIS — I69315 Cognitive social or emotional deficit following cerebral infarction: Secondary | ICD-10-CM | POA: Diagnosis not present

## 2018-06-11 DIAGNOSIS — R569 Unspecified convulsions: Secondary | ICD-10-CM | POA: Diagnosis not present

## 2018-06-12 DIAGNOSIS — R569 Unspecified convulsions: Secondary | ICD-10-CM | POA: Diagnosis not present

## 2018-06-12 DIAGNOSIS — I69315 Cognitive social or emotional deficit following cerebral infarction: Secondary | ICD-10-CM | POA: Diagnosis not present

## 2018-06-13 DIAGNOSIS — R569 Unspecified convulsions: Secondary | ICD-10-CM | POA: Diagnosis not present

## 2018-06-13 DIAGNOSIS — I69315 Cognitive social or emotional deficit following cerebral infarction: Secondary | ICD-10-CM | POA: Diagnosis not present

## 2018-06-14 DIAGNOSIS — R569 Unspecified convulsions: Secondary | ICD-10-CM | POA: Diagnosis not present

## 2018-06-14 DIAGNOSIS — I69315 Cognitive social or emotional deficit following cerebral infarction: Secondary | ICD-10-CM | POA: Diagnosis not present

## 2018-06-15 DIAGNOSIS — R569 Unspecified convulsions: Secondary | ICD-10-CM | POA: Diagnosis not present

## 2018-06-15 DIAGNOSIS — I69315 Cognitive social or emotional deficit following cerebral infarction: Secondary | ICD-10-CM | POA: Diagnosis not present

## 2018-06-16 DIAGNOSIS — I69315 Cognitive social or emotional deficit following cerebral infarction: Secondary | ICD-10-CM | POA: Diagnosis not present

## 2018-06-16 DIAGNOSIS — R569 Unspecified convulsions: Secondary | ICD-10-CM | POA: Diagnosis not present

## 2018-06-17 DIAGNOSIS — R569 Unspecified convulsions: Secondary | ICD-10-CM | POA: Diagnosis not present

## 2018-06-17 DIAGNOSIS — I69315 Cognitive social or emotional deficit following cerebral infarction: Secondary | ICD-10-CM | POA: Diagnosis not present

## 2018-06-18 DIAGNOSIS — R569 Unspecified convulsions: Secondary | ICD-10-CM | POA: Diagnosis not present

## 2018-06-18 DIAGNOSIS — I69315 Cognitive social or emotional deficit following cerebral infarction: Secondary | ICD-10-CM | POA: Diagnosis not present

## 2018-06-19 DIAGNOSIS — R569 Unspecified convulsions: Secondary | ICD-10-CM | POA: Diagnosis not present

## 2018-06-19 DIAGNOSIS — I69315 Cognitive social or emotional deficit following cerebral infarction: Secondary | ICD-10-CM | POA: Diagnosis not present

## 2018-06-20 DIAGNOSIS — I69315 Cognitive social or emotional deficit following cerebral infarction: Secondary | ICD-10-CM | POA: Diagnosis not present

## 2018-06-20 DIAGNOSIS — R569 Unspecified convulsions: Secondary | ICD-10-CM | POA: Diagnosis not present

## 2018-06-21 DIAGNOSIS — I69315 Cognitive social or emotional deficit following cerebral infarction: Secondary | ICD-10-CM | POA: Diagnosis not present

## 2018-06-21 DIAGNOSIS — R569 Unspecified convulsions: Secondary | ICD-10-CM | POA: Diagnosis not present

## 2018-06-22 DIAGNOSIS — I69315 Cognitive social or emotional deficit following cerebral infarction: Secondary | ICD-10-CM | POA: Diagnosis not present

## 2018-06-22 DIAGNOSIS — R569 Unspecified convulsions: Secondary | ICD-10-CM | POA: Diagnosis not present

## 2018-06-23 DIAGNOSIS — I69315 Cognitive social or emotional deficit following cerebral infarction: Secondary | ICD-10-CM | POA: Diagnosis not present

## 2018-06-23 DIAGNOSIS — R569 Unspecified convulsions: Secondary | ICD-10-CM | POA: Diagnosis not present

## 2018-06-24 DIAGNOSIS — R569 Unspecified convulsions: Secondary | ICD-10-CM | POA: Diagnosis not present

## 2018-06-24 DIAGNOSIS — I69315 Cognitive social or emotional deficit following cerebral infarction: Secondary | ICD-10-CM | POA: Diagnosis not present

## 2018-06-25 DIAGNOSIS — R569 Unspecified convulsions: Secondary | ICD-10-CM | POA: Diagnosis not present

## 2018-06-25 DIAGNOSIS — I69315 Cognitive social or emotional deficit following cerebral infarction: Secondary | ICD-10-CM | POA: Diagnosis not present

## 2018-06-26 DIAGNOSIS — R569 Unspecified convulsions: Secondary | ICD-10-CM | POA: Diagnosis not present

## 2018-06-26 DIAGNOSIS — I69315 Cognitive social or emotional deficit following cerebral infarction: Secondary | ICD-10-CM | POA: Diagnosis not present

## 2018-06-27 DIAGNOSIS — I69315 Cognitive social or emotional deficit following cerebral infarction: Secondary | ICD-10-CM | POA: Diagnosis not present

## 2018-06-27 DIAGNOSIS — R569 Unspecified convulsions: Secondary | ICD-10-CM | POA: Diagnosis not present

## 2018-06-28 DIAGNOSIS — R569 Unspecified convulsions: Secondary | ICD-10-CM | POA: Diagnosis not present

## 2018-06-28 DIAGNOSIS — I69315 Cognitive social or emotional deficit following cerebral infarction: Secondary | ICD-10-CM | POA: Diagnosis not present

## 2018-06-29 DIAGNOSIS — R569 Unspecified convulsions: Secondary | ICD-10-CM | POA: Diagnosis not present

## 2018-06-29 DIAGNOSIS — I69315 Cognitive social or emotional deficit following cerebral infarction: Secondary | ICD-10-CM | POA: Diagnosis not present

## 2018-06-30 DIAGNOSIS — I69315 Cognitive social or emotional deficit following cerebral infarction: Secondary | ICD-10-CM | POA: Diagnosis not present

## 2018-06-30 DIAGNOSIS — R569 Unspecified convulsions: Secondary | ICD-10-CM | POA: Diagnosis not present

## 2018-07-01 DIAGNOSIS — R569 Unspecified convulsions: Secondary | ICD-10-CM | POA: Diagnosis not present

## 2018-07-01 DIAGNOSIS — I69315 Cognitive social or emotional deficit following cerebral infarction: Secondary | ICD-10-CM | POA: Diagnosis not present

## 2018-07-02 DIAGNOSIS — I69315 Cognitive social or emotional deficit following cerebral infarction: Secondary | ICD-10-CM | POA: Diagnosis not present

## 2018-07-02 DIAGNOSIS — R569 Unspecified convulsions: Secondary | ICD-10-CM | POA: Diagnosis not present

## 2018-07-03 DIAGNOSIS — I69315 Cognitive social or emotional deficit following cerebral infarction: Secondary | ICD-10-CM | POA: Diagnosis not present

## 2018-07-03 DIAGNOSIS — R569 Unspecified convulsions: Secondary | ICD-10-CM | POA: Diagnosis not present

## 2018-07-04 DIAGNOSIS — I69315 Cognitive social or emotional deficit following cerebral infarction: Secondary | ICD-10-CM | POA: Diagnosis not present

## 2018-07-04 DIAGNOSIS — R569 Unspecified convulsions: Secondary | ICD-10-CM | POA: Diagnosis not present

## 2018-07-05 DIAGNOSIS — I69315 Cognitive social or emotional deficit following cerebral infarction: Secondary | ICD-10-CM | POA: Diagnosis not present

## 2018-07-05 DIAGNOSIS — R569 Unspecified convulsions: Secondary | ICD-10-CM | POA: Diagnosis not present

## 2018-07-06 DIAGNOSIS — R569 Unspecified convulsions: Secondary | ICD-10-CM | POA: Diagnosis not present

## 2018-07-06 DIAGNOSIS — I69315 Cognitive social or emotional deficit following cerebral infarction: Secondary | ICD-10-CM | POA: Diagnosis not present

## 2018-07-07 DIAGNOSIS — R569 Unspecified convulsions: Secondary | ICD-10-CM | POA: Diagnosis not present

## 2018-07-07 DIAGNOSIS — I69315 Cognitive social or emotional deficit following cerebral infarction: Secondary | ICD-10-CM | POA: Diagnosis not present

## 2018-07-08 DIAGNOSIS — R569 Unspecified convulsions: Secondary | ICD-10-CM | POA: Diagnosis not present

## 2018-07-08 DIAGNOSIS — I69315 Cognitive social or emotional deficit following cerebral infarction: Secondary | ICD-10-CM | POA: Diagnosis not present

## 2018-07-09 DIAGNOSIS — I69315 Cognitive social or emotional deficit following cerebral infarction: Secondary | ICD-10-CM | POA: Diagnosis not present

## 2018-07-09 DIAGNOSIS — R569 Unspecified convulsions: Secondary | ICD-10-CM | POA: Diagnosis not present

## 2018-07-10 DIAGNOSIS — I69315 Cognitive social or emotional deficit following cerebral infarction: Secondary | ICD-10-CM | POA: Diagnosis not present

## 2018-07-10 DIAGNOSIS — R569 Unspecified convulsions: Secondary | ICD-10-CM | POA: Diagnosis not present

## 2018-07-11 DIAGNOSIS — I69315 Cognitive social or emotional deficit following cerebral infarction: Secondary | ICD-10-CM | POA: Diagnosis not present

## 2018-07-11 DIAGNOSIS — R569 Unspecified convulsions: Secondary | ICD-10-CM | POA: Diagnosis not present

## 2018-07-12 DIAGNOSIS — I69315 Cognitive social or emotional deficit following cerebral infarction: Secondary | ICD-10-CM | POA: Diagnosis not present

## 2018-07-12 DIAGNOSIS — R569 Unspecified convulsions: Secondary | ICD-10-CM | POA: Diagnosis not present

## 2018-07-13 DIAGNOSIS — R569 Unspecified convulsions: Secondary | ICD-10-CM | POA: Diagnosis not present

## 2018-07-13 DIAGNOSIS — I69315 Cognitive social or emotional deficit following cerebral infarction: Secondary | ICD-10-CM | POA: Diagnosis not present

## 2018-07-14 DIAGNOSIS — R569 Unspecified convulsions: Secondary | ICD-10-CM | POA: Diagnosis not present

## 2018-07-14 DIAGNOSIS — I69315 Cognitive social or emotional deficit following cerebral infarction: Secondary | ICD-10-CM | POA: Diagnosis not present

## 2018-07-15 DIAGNOSIS — R569 Unspecified convulsions: Secondary | ICD-10-CM | POA: Diagnosis not present

## 2018-07-15 DIAGNOSIS — I69315 Cognitive social or emotional deficit following cerebral infarction: Secondary | ICD-10-CM | POA: Diagnosis not present

## 2018-07-16 DIAGNOSIS — I69315 Cognitive social or emotional deficit following cerebral infarction: Secondary | ICD-10-CM | POA: Diagnosis not present

## 2018-07-16 DIAGNOSIS — R569 Unspecified convulsions: Secondary | ICD-10-CM | POA: Diagnosis not present

## 2018-07-17 DIAGNOSIS — R569 Unspecified convulsions: Secondary | ICD-10-CM | POA: Diagnosis not present

## 2018-07-17 DIAGNOSIS — I69315 Cognitive social or emotional deficit following cerebral infarction: Secondary | ICD-10-CM | POA: Diagnosis not present

## 2018-07-18 DIAGNOSIS — R569 Unspecified convulsions: Secondary | ICD-10-CM | POA: Diagnosis not present

## 2018-07-18 DIAGNOSIS — I69315 Cognitive social or emotional deficit following cerebral infarction: Secondary | ICD-10-CM | POA: Diagnosis not present

## 2018-07-19 DIAGNOSIS — I69315 Cognitive social or emotional deficit following cerebral infarction: Secondary | ICD-10-CM | POA: Diagnosis not present

## 2018-07-19 DIAGNOSIS — R569 Unspecified convulsions: Secondary | ICD-10-CM | POA: Diagnosis not present

## 2018-07-20 DIAGNOSIS — I69315 Cognitive social or emotional deficit following cerebral infarction: Secondary | ICD-10-CM | POA: Diagnosis not present

## 2018-07-20 DIAGNOSIS — R569 Unspecified convulsions: Secondary | ICD-10-CM | POA: Diagnosis not present

## 2018-07-21 DIAGNOSIS — I69315 Cognitive social or emotional deficit following cerebral infarction: Secondary | ICD-10-CM | POA: Diagnosis not present

## 2018-07-21 DIAGNOSIS — R569 Unspecified convulsions: Secondary | ICD-10-CM | POA: Diagnosis not present

## 2018-07-22 DIAGNOSIS — I69315 Cognitive social or emotional deficit following cerebral infarction: Secondary | ICD-10-CM | POA: Diagnosis not present

## 2018-07-22 DIAGNOSIS — R569 Unspecified convulsions: Secondary | ICD-10-CM | POA: Diagnosis not present

## 2018-07-23 DIAGNOSIS — I69315 Cognitive social or emotional deficit following cerebral infarction: Secondary | ICD-10-CM | POA: Diagnosis not present

## 2018-07-23 DIAGNOSIS — R569 Unspecified convulsions: Secondary | ICD-10-CM | POA: Diagnosis not present

## 2018-07-24 DIAGNOSIS — R569 Unspecified convulsions: Secondary | ICD-10-CM | POA: Diagnosis not present

## 2018-07-24 DIAGNOSIS — I69315 Cognitive social or emotional deficit following cerebral infarction: Secondary | ICD-10-CM | POA: Diagnosis not present

## 2018-07-25 DIAGNOSIS — I69315 Cognitive social or emotional deficit following cerebral infarction: Secondary | ICD-10-CM | POA: Diagnosis not present

## 2018-07-25 DIAGNOSIS — R569 Unspecified convulsions: Secondary | ICD-10-CM | POA: Diagnosis not present

## 2018-07-26 DIAGNOSIS — I69315 Cognitive social or emotional deficit following cerebral infarction: Secondary | ICD-10-CM | POA: Diagnosis not present

## 2018-07-26 DIAGNOSIS — R569 Unspecified convulsions: Secondary | ICD-10-CM | POA: Diagnosis not present

## 2018-07-27 DIAGNOSIS — I69315 Cognitive social or emotional deficit following cerebral infarction: Secondary | ICD-10-CM | POA: Diagnosis not present

## 2018-07-27 DIAGNOSIS — R569 Unspecified convulsions: Secondary | ICD-10-CM | POA: Diagnosis not present

## 2018-07-28 DIAGNOSIS — I69315 Cognitive social or emotional deficit following cerebral infarction: Secondary | ICD-10-CM | POA: Diagnosis not present

## 2018-07-28 DIAGNOSIS — R569 Unspecified convulsions: Secondary | ICD-10-CM | POA: Diagnosis not present

## 2018-07-29 DIAGNOSIS — I69315 Cognitive social or emotional deficit following cerebral infarction: Secondary | ICD-10-CM | POA: Diagnosis not present

## 2018-07-29 DIAGNOSIS — R569 Unspecified convulsions: Secondary | ICD-10-CM | POA: Diagnosis not present

## 2018-07-30 DIAGNOSIS — I69315 Cognitive social or emotional deficit following cerebral infarction: Secondary | ICD-10-CM | POA: Diagnosis not present

## 2018-07-30 DIAGNOSIS — R569 Unspecified convulsions: Secondary | ICD-10-CM | POA: Diagnosis not present

## 2018-07-31 DIAGNOSIS — R569 Unspecified convulsions: Secondary | ICD-10-CM | POA: Diagnosis not present

## 2018-07-31 DIAGNOSIS — I69315 Cognitive social or emotional deficit following cerebral infarction: Secondary | ICD-10-CM | POA: Diagnosis not present

## 2018-08-01 DIAGNOSIS — I69315 Cognitive social or emotional deficit following cerebral infarction: Secondary | ICD-10-CM | POA: Diagnosis not present

## 2018-08-01 DIAGNOSIS — R569 Unspecified convulsions: Secondary | ICD-10-CM | POA: Diagnosis not present

## 2018-08-02 DIAGNOSIS — I69315 Cognitive social or emotional deficit following cerebral infarction: Secondary | ICD-10-CM | POA: Diagnosis not present

## 2018-08-02 DIAGNOSIS — R569 Unspecified convulsions: Secondary | ICD-10-CM | POA: Diagnosis not present

## 2018-08-03 DIAGNOSIS — I69315 Cognitive social or emotional deficit following cerebral infarction: Secondary | ICD-10-CM | POA: Diagnosis not present

## 2018-08-03 DIAGNOSIS — R569 Unspecified convulsions: Secondary | ICD-10-CM | POA: Diagnosis not present

## 2018-08-04 DIAGNOSIS — I69315 Cognitive social or emotional deficit following cerebral infarction: Secondary | ICD-10-CM | POA: Diagnosis not present

## 2018-08-04 DIAGNOSIS — R569 Unspecified convulsions: Secondary | ICD-10-CM | POA: Diagnosis not present

## 2018-08-05 DIAGNOSIS — R569 Unspecified convulsions: Secondary | ICD-10-CM | POA: Diagnosis not present

## 2018-08-05 DIAGNOSIS — I69315 Cognitive social or emotional deficit following cerebral infarction: Secondary | ICD-10-CM | POA: Diagnosis not present

## 2018-08-06 DIAGNOSIS — R569 Unspecified convulsions: Secondary | ICD-10-CM | POA: Diagnosis not present

## 2018-08-06 DIAGNOSIS — I69315 Cognitive social or emotional deficit following cerebral infarction: Secondary | ICD-10-CM | POA: Diagnosis not present

## 2018-08-07 DIAGNOSIS — I69315 Cognitive social or emotional deficit following cerebral infarction: Secondary | ICD-10-CM | POA: Diagnosis not present

## 2018-08-07 DIAGNOSIS — R569 Unspecified convulsions: Secondary | ICD-10-CM | POA: Diagnosis not present

## 2018-08-08 DIAGNOSIS — R569 Unspecified convulsions: Secondary | ICD-10-CM | POA: Diagnosis not present

## 2018-08-08 DIAGNOSIS — I69315 Cognitive social or emotional deficit following cerebral infarction: Secondary | ICD-10-CM | POA: Diagnosis not present

## 2018-08-09 DIAGNOSIS — R569 Unspecified convulsions: Secondary | ICD-10-CM | POA: Diagnosis not present

## 2018-08-09 DIAGNOSIS — I69315 Cognitive social or emotional deficit following cerebral infarction: Secondary | ICD-10-CM | POA: Diagnosis not present

## 2018-08-10 DIAGNOSIS — R569 Unspecified convulsions: Secondary | ICD-10-CM | POA: Diagnosis not present

## 2018-08-10 DIAGNOSIS — I69315 Cognitive social or emotional deficit following cerebral infarction: Secondary | ICD-10-CM | POA: Diagnosis not present

## 2018-08-11 DIAGNOSIS — R569 Unspecified convulsions: Secondary | ICD-10-CM | POA: Diagnosis not present

## 2018-08-11 DIAGNOSIS — I69315 Cognitive social or emotional deficit following cerebral infarction: Secondary | ICD-10-CM | POA: Diagnosis not present

## 2018-08-26 DIAGNOSIS — I69315 Cognitive social or emotional deficit following cerebral infarction: Secondary | ICD-10-CM | POA: Diagnosis not present

## 2018-08-26 DIAGNOSIS — R569 Unspecified convulsions: Secondary | ICD-10-CM | POA: Diagnosis not present

## 2018-08-27 DIAGNOSIS — I69315 Cognitive social or emotional deficit following cerebral infarction: Secondary | ICD-10-CM | POA: Diagnosis not present

## 2018-08-27 DIAGNOSIS — R569 Unspecified convulsions: Secondary | ICD-10-CM | POA: Diagnosis not present

## 2018-08-28 DIAGNOSIS — R569 Unspecified convulsions: Secondary | ICD-10-CM | POA: Diagnosis not present

## 2018-08-28 DIAGNOSIS — I69315 Cognitive social or emotional deficit following cerebral infarction: Secondary | ICD-10-CM | POA: Diagnosis not present

## 2018-08-29 DIAGNOSIS — I69315 Cognitive social or emotional deficit following cerebral infarction: Secondary | ICD-10-CM | POA: Diagnosis not present

## 2018-08-29 DIAGNOSIS — R569 Unspecified convulsions: Secondary | ICD-10-CM | POA: Diagnosis not present

## 2018-08-31 DIAGNOSIS — I69315 Cognitive social or emotional deficit following cerebral infarction: Secondary | ICD-10-CM | POA: Diagnosis not present

## 2018-08-31 DIAGNOSIS — R569 Unspecified convulsions: Secondary | ICD-10-CM | POA: Diagnosis not present

## 2018-09-01 DIAGNOSIS — I69315 Cognitive social or emotional deficit following cerebral infarction: Secondary | ICD-10-CM | POA: Diagnosis not present

## 2018-09-01 DIAGNOSIS — R569 Unspecified convulsions: Secondary | ICD-10-CM | POA: Diagnosis not present

## 2018-09-02 DIAGNOSIS — I69315 Cognitive social or emotional deficit following cerebral infarction: Secondary | ICD-10-CM | POA: Diagnosis not present

## 2018-09-02 DIAGNOSIS — R569 Unspecified convulsions: Secondary | ICD-10-CM | POA: Diagnosis not present

## 2018-09-03 DIAGNOSIS — R569 Unspecified convulsions: Secondary | ICD-10-CM | POA: Diagnosis not present

## 2018-09-03 DIAGNOSIS — I69315 Cognitive social or emotional deficit following cerebral infarction: Secondary | ICD-10-CM | POA: Diagnosis not present

## 2018-09-04 DIAGNOSIS — R569 Unspecified convulsions: Secondary | ICD-10-CM | POA: Diagnosis not present

## 2018-09-04 DIAGNOSIS — I69315 Cognitive social or emotional deficit following cerebral infarction: Secondary | ICD-10-CM | POA: Diagnosis not present

## 2018-09-05 DIAGNOSIS — R569 Unspecified convulsions: Secondary | ICD-10-CM | POA: Diagnosis not present

## 2018-09-05 DIAGNOSIS — I69315 Cognitive social or emotional deficit following cerebral infarction: Secondary | ICD-10-CM | POA: Diagnosis not present

## 2018-09-06 DIAGNOSIS — I69315 Cognitive social or emotional deficit following cerebral infarction: Secondary | ICD-10-CM | POA: Diagnosis not present

## 2018-09-06 DIAGNOSIS — R569 Unspecified convulsions: Secondary | ICD-10-CM | POA: Diagnosis not present

## 2018-09-07 DIAGNOSIS — R569 Unspecified convulsions: Secondary | ICD-10-CM | POA: Diagnosis not present

## 2018-09-07 DIAGNOSIS — I69315 Cognitive social or emotional deficit following cerebral infarction: Secondary | ICD-10-CM | POA: Diagnosis not present

## 2018-09-08 DIAGNOSIS — I69315 Cognitive social or emotional deficit following cerebral infarction: Secondary | ICD-10-CM | POA: Diagnosis not present

## 2018-09-08 DIAGNOSIS — R569 Unspecified convulsions: Secondary | ICD-10-CM | POA: Diagnosis not present

## 2018-09-09 DIAGNOSIS — R569 Unspecified convulsions: Secondary | ICD-10-CM | POA: Diagnosis not present

## 2018-09-09 DIAGNOSIS — I69315 Cognitive social or emotional deficit following cerebral infarction: Secondary | ICD-10-CM | POA: Diagnosis not present

## 2018-09-10 DIAGNOSIS — R569 Unspecified convulsions: Secondary | ICD-10-CM | POA: Diagnosis not present

## 2018-09-10 DIAGNOSIS — I69315 Cognitive social or emotional deficit following cerebral infarction: Secondary | ICD-10-CM | POA: Diagnosis not present

## 2018-09-11 DIAGNOSIS — R569 Unspecified convulsions: Secondary | ICD-10-CM | POA: Diagnosis not present

## 2018-09-11 DIAGNOSIS — I69315 Cognitive social or emotional deficit following cerebral infarction: Secondary | ICD-10-CM | POA: Diagnosis not present

## 2018-09-13 DIAGNOSIS — I69315 Cognitive social or emotional deficit following cerebral infarction: Secondary | ICD-10-CM | POA: Diagnosis not present

## 2018-09-13 DIAGNOSIS — R569 Unspecified convulsions: Secondary | ICD-10-CM | POA: Diagnosis not present

## 2018-09-14 DIAGNOSIS — I69315 Cognitive social or emotional deficit following cerebral infarction: Secondary | ICD-10-CM | POA: Diagnosis not present

## 2018-09-14 DIAGNOSIS — R569 Unspecified convulsions: Secondary | ICD-10-CM | POA: Diagnosis not present

## 2018-09-15 DIAGNOSIS — R569 Unspecified convulsions: Secondary | ICD-10-CM | POA: Diagnosis not present

## 2018-09-15 DIAGNOSIS — I69315 Cognitive social or emotional deficit following cerebral infarction: Secondary | ICD-10-CM | POA: Diagnosis not present

## 2018-09-16 DIAGNOSIS — I69315 Cognitive social or emotional deficit following cerebral infarction: Secondary | ICD-10-CM | POA: Diagnosis not present

## 2018-09-16 DIAGNOSIS — R569 Unspecified convulsions: Secondary | ICD-10-CM | POA: Diagnosis not present

## 2018-09-17 DIAGNOSIS — R569 Unspecified convulsions: Secondary | ICD-10-CM | POA: Diagnosis not present

## 2018-09-17 DIAGNOSIS — I69315 Cognitive social or emotional deficit following cerebral infarction: Secondary | ICD-10-CM | POA: Diagnosis not present

## 2018-09-18 DIAGNOSIS — I69315 Cognitive social or emotional deficit following cerebral infarction: Secondary | ICD-10-CM | POA: Diagnosis not present

## 2018-09-18 DIAGNOSIS — R569 Unspecified convulsions: Secondary | ICD-10-CM | POA: Diagnosis not present

## 2018-09-19 DIAGNOSIS — R569 Unspecified convulsions: Secondary | ICD-10-CM | POA: Diagnosis not present

## 2018-09-19 DIAGNOSIS — I69315 Cognitive social or emotional deficit following cerebral infarction: Secondary | ICD-10-CM | POA: Diagnosis not present

## 2018-09-20 DIAGNOSIS — I69315 Cognitive social or emotional deficit following cerebral infarction: Secondary | ICD-10-CM | POA: Diagnosis not present

## 2018-09-20 DIAGNOSIS — R569 Unspecified convulsions: Secondary | ICD-10-CM | POA: Diagnosis not present

## 2018-09-21 DIAGNOSIS — I69315 Cognitive social or emotional deficit following cerebral infarction: Secondary | ICD-10-CM | POA: Diagnosis not present

## 2018-09-21 DIAGNOSIS — R569 Unspecified convulsions: Secondary | ICD-10-CM | POA: Diagnosis not present

## 2018-09-22 DIAGNOSIS — I69315 Cognitive social or emotional deficit following cerebral infarction: Secondary | ICD-10-CM | POA: Diagnosis not present

## 2018-09-22 DIAGNOSIS — R569 Unspecified convulsions: Secondary | ICD-10-CM | POA: Diagnosis not present

## 2018-09-23 DIAGNOSIS — R569 Unspecified convulsions: Secondary | ICD-10-CM | POA: Diagnosis not present

## 2018-09-23 DIAGNOSIS — I69315 Cognitive social or emotional deficit following cerebral infarction: Secondary | ICD-10-CM | POA: Diagnosis not present

## 2018-09-24 DIAGNOSIS — R569 Unspecified convulsions: Secondary | ICD-10-CM | POA: Diagnosis not present

## 2018-09-24 DIAGNOSIS — I69315 Cognitive social or emotional deficit following cerebral infarction: Secondary | ICD-10-CM | POA: Diagnosis not present

## 2018-09-25 DIAGNOSIS — R569 Unspecified convulsions: Secondary | ICD-10-CM | POA: Diagnosis not present

## 2018-09-25 DIAGNOSIS — I69315 Cognitive social or emotional deficit following cerebral infarction: Secondary | ICD-10-CM | POA: Diagnosis not present

## 2018-09-26 DIAGNOSIS — R569 Unspecified convulsions: Secondary | ICD-10-CM | POA: Diagnosis not present

## 2018-09-26 DIAGNOSIS — I69315 Cognitive social or emotional deficit following cerebral infarction: Secondary | ICD-10-CM | POA: Diagnosis not present

## 2018-09-27 DIAGNOSIS — I69315 Cognitive social or emotional deficit following cerebral infarction: Secondary | ICD-10-CM | POA: Diagnosis not present

## 2018-09-27 DIAGNOSIS — R569 Unspecified convulsions: Secondary | ICD-10-CM | POA: Diagnosis not present

## 2018-09-28 DIAGNOSIS — I69315 Cognitive social or emotional deficit following cerebral infarction: Secondary | ICD-10-CM | POA: Diagnosis not present

## 2018-09-28 DIAGNOSIS — R569 Unspecified convulsions: Secondary | ICD-10-CM | POA: Diagnosis not present

## 2018-09-29 DIAGNOSIS — R569 Unspecified convulsions: Secondary | ICD-10-CM | POA: Diagnosis not present

## 2018-09-29 DIAGNOSIS — I69315 Cognitive social or emotional deficit following cerebral infarction: Secondary | ICD-10-CM | POA: Diagnosis not present

## 2018-09-30 DIAGNOSIS — I69315 Cognitive social or emotional deficit following cerebral infarction: Secondary | ICD-10-CM | POA: Diagnosis not present

## 2018-09-30 DIAGNOSIS — R569 Unspecified convulsions: Secondary | ICD-10-CM | POA: Diagnosis not present

## 2018-10-01 DIAGNOSIS — I69315 Cognitive social or emotional deficit following cerebral infarction: Secondary | ICD-10-CM | POA: Diagnosis not present

## 2018-10-01 DIAGNOSIS — R569 Unspecified convulsions: Secondary | ICD-10-CM | POA: Diagnosis not present

## 2018-10-02 DIAGNOSIS — I69315 Cognitive social or emotional deficit following cerebral infarction: Secondary | ICD-10-CM | POA: Diagnosis not present

## 2018-10-02 DIAGNOSIS — R569 Unspecified convulsions: Secondary | ICD-10-CM | POA: Diagnosis not present

## 2018-10-03 DIAGNOSIS — R569 Unspecified convulsions: Secondary | ICD-10-CM | POA: Diagnosis not present

## 2018-10-03 DIAGNOSIS — I69315 Cognitive social or emotional deficit following cerebral infarction: Secondary | ICD-10-CM | POA: Diagnosis not present

## 2018-10-04 DIAGNOSIS — I69315 Cognitive social or emotional deficit following cerebral infarction: Secondary | ICD-10-CM | POA: Diagnosis not present

## 2018-10-04 DIAGNOSIS — R569 Unspecified convulsions: Secondary | ICD-10-CM | POA: Diagnosis not present

## 2018-10-05 DIAGNOSIS — I69315 Cognitive social or emotional deficit following cerebral infarction: Secondary | ICD-10-CM | POA: Diagnosis not present

## 2018-10-05 DIAGNOSIS — R569 Unspecified convulsions: Secondary | ICD-10-CM | POA: Diagnosis not present

## 2018-10-06 DIAGNOSIS — R569 Unspecified convulsions: Secondary | ICD-10-CM | POA: Diagnosis not present

## 2018-10-06 DIAGNOSIS — I69315 Cognitive social or emotional deficit following cerebral infarction: Secondary | ICD-10-CM | POA: Diagnosis not present

## 2018-10-07 DIAGNOSIS — I69315 Cognitive social or emotional deficit following cerebral infarction: Secondary | ICD-10-CM | POA: Diagnosis not present

## 2018-10-07 DIAGNOSIS — R569 Unspecified convulsions: Secondary | ICD-10-CM | POA: Diagnosis not present

## 2018-10-08 DIAGNOSIS — R569 Unspecified convulsions: Secondary | ICD-10-CM | POA: Diagnosis not present

## 2018-10-08 DIAGNOSIS — I69315 Cognitive social or emotional deficit following cerebral infarction: Secondary | ICD-10-CM | POA: Diagnosis not present

## 2018-10-09 DIAGNOSIS — R569 Unspecified convulsions: Secondary | ICD-10-CM | POA: Diagnosis not present

## 2018-10-09 DIAGNOSIS — I69315 Cognitive social or emotional deficit following cerebral infarction: Secondary | ICD-10-CM | POA: Diagnosis not present

## 2018-10-10 DIAGNOSIS — R569 Unspecified convulsions: Secondary | ICD-10-CM | POA: Diagnosis not present

## 2018-10-10 DIAGNOSIS — I69315 Cognitive social or emotional deficit following cerebral infarction: Secondary | ICD-10-CM | POA: Diagnosis not present

## 2018-10-11 DIAGNOSIS — I69315 Cognitive social or emotional deficit following cerebral infarction: Secondary | ICD-10-CM | POA: Diagnosis not present

## 2018-10-11 DIAGNOSIS — R569 Unspecified convulsions: Secondary | ICD-10-CM | POA: Diagnosis not present

## 2018-10-12 DIAGNOSIS — R569 Unspecified convulsions: Secondary | ICD-10-CM | POA: Diagnosis not present

## 2018-10-12 DIAGNOSIS — I69315 Cognitive social or emotional deficit following cerebral infarction: Secondary | ICD-10-CM | POA: Diagnosis not present

## 2018-10-13 DIAGNOSIS — I69315 Cognitive social or emotional deficit following cerebral infarction: Secondary | ICD-10-CM | POA: Diagnosis not present

## 2018-10-13 DIAGNOSIS — R569 Unspecified convulsions: Secondary | ICD-10-CM | POA: Diagnosis not present

## 2018-10-14 DIAGNOSIS — M6282 Rhabdomyolysis: Secondary | ICD-10-CM | POA: Diagnosis not present

## 2018-10-14 DIAGNOSIS — R6889 Other general symptoms and signs: Secondary | ICD-10-CM | POA: Diagnosis not present

## 2018-10-14 DIAGNOSIS — R1310 Dysphagia, unspecified: Secondary | ICD-10-CM | POA: Diagnosis not present

## 2018-10-14 DIAGNOSIS — I6523 Occlusion and stenosis of bilateral carotid arteries: Secondary | ICD-10-CM | POA: Diagnosis not present

## 2018-10-14 DIAGNOSIS — I639 Cerebral infarction, unspecified: Secondary | ICD-10-CM | POA: Diagnosis not present

## 2018-10-14 DIAGNOSIS — N390 Urinary tract infection, site not specified: Secondary | ICD-10-CM | POA: Diagnosis not present

## 2018-10-14 DIAGNOSIS — G9340 Encephalopathy, unspecified: Secondary | ICD-10-CM | POA: Diagnosis not present

## 2018-10-14 DIAGNOSIS — S0990XA Unspecified injury of head, initial encounter: Secondary | ICD-10-CM | POA: Diagnosis not present

## 2018-10-14 DIAGNOSIS — F319 Bipolar disorder, unspecified: Secondary | ICD-10-CM | POA: Diagnosis not present

## 2018-10-14 DIAGNOSIS — M542 Cervicalgia: Secondary | ICD-10-CM | POA: Diagnosis not present

## 2018-10-14 DIAGNOSIS — R2681 Unsteadiness on feet: Secondary | ICD-10-CM | POA: Diagnosis not present

## 2018-10-14 DIAGNOSIS — I129 Hypertensive chronic kidney disease with stage 1 through stage 4 chronic kidney disease, or unspecified chronic kidney disease: Secondary | ICD-10-CM | POA: Diagnosis not present

## 2018-10-14 DIAGNOSIS — S0101XA Laceration without foreign body of scalp, initial encounter: Secondary | ICD-10-CM | POA: Diagnosis not present

## 2018-10-14 DIAGNOSIS — I6381 Other cerebral infarction due to occlusion or stenosis of small artery: Secondary | ICD-10-CM | POA: Diagnosis not present

## 2018-10-14 DIAGNOSIS — G9349 Other encephalopathy: Secondary | ICD-10-CM | POA: Diagnosis not present

## 2018-10-14 DIAGNOSIS — I160 Hypertensive urgency: Secondary | ICD-10-CM | POA: Diagnosis not present

## 2018-10-14 DIAGNOSIS — I10 Essential (primary) hypertension: Secondary | ICD-10-CM | POA: Diagnosis not present

## 2018-10-14 DIAGNOSIS — I69315 Cognitive social or emotional deficit following cerebral infarction: Secondary | ICD-10-CM | POA: Diagnosis not present

## 2018-10-14 DIAGNOSIS — R4182 Altered mental status, unspecified: Secondary | ICD-10-CM | POA: Diagnosis not present

## 2018-10-14 DIAGNOSIS — N179 Acute kidney failure, unspecified: Secondary | ICD-10-CM | POA: Diagnosis not present

## 2018-10-14 DIAGNOSIS — I679 Cerebrovascular disease, unspecified: Secondary | ICD-10-CM | POA: Diagnosis not present

## 2018-10-14 DIAGNOSIS — S0993XA Unspecified injury of face, initial encounter: Secondary | ICD-10-CM | POA: Diagnosis not present

## 2018-10-14 DIAGNOSIS — R011 Cardiac murmur, unspecified: Secondary | ICD-10-CM | POA: Diagnosis not present

## 2018-10-14 DIAGNOSIS — E860 Dehydration: Secondary | ICD-10-CM | POA: Diagnosis not present

## 2018-10-14 DIAGNOSIS — N183 Chronic kidney disease, stage 3 (moderate): Secondary | ICD-10-CM | POA: Diagnosis not present

## 2018-10-14 DIAGNOSIS — M6281 Muscle weakness (generalized): Secondary | ICD-10-CM | POA: Diagnosis not present

## 2018-10-14 DIAGNOSIS — R0602 Shortness of breath: Secondary | ICD-10-CM | POA: Diagnosis not present

## 2018-10-14 DIAGNOSIS — R001 Bradycardia, unspecified: Secondary | ICD-10-CM | POA: Diagnosis not present

## 2018-10-14 DIAGNOSIS — I6782 Cerebral ischemia: Secondary | ICD-10-CM | POA: Diagnosis not present

## 2018-10-14 DIAGNOSIS — E1122 Type 2 diabetes mellitus with diabetic chronic kidney disease: Secondary | ICD-10-CM | POA: Diagnosis not present

## 2018-10-14 DIAGNOSIS — R569 Unspecified convulsions: Secondary | ICD-10-CM | POA: Diagnosis not present

## 2018-10-14 DIAGNOSIS — F1410 Cocaine abuse, uncomplicated: Secondary | ICD-10-CM | POA: Diagnosis not present

## 2018-10-14 DIAGNOSIS — F1910 Other psychoactive substance abuse, uncomplicated: Secondary | ICD-10-CM | POA: Diagnosis not present

## 2018-10-14 DIAGNOSIS — T7611XA Adult physical abuse, suspected, initial encounter: Secondary | ICD-10-CM | POA: Diagnosis not present

## 2018-10-15 DIAGNOSIS — I69315 Cognitive social or emotional deficit following cerebral infarction: Secondary | ICD-10-CM | POA: Diagnosis not present

## 2018-10-15 DIAGNOSIS — R569 Unspecified convulsions: Secondary | ICD-10-CM | POA: Diagnosis not present

## 2018-10-16 DIAGNOSIS — I69315 Cognitive social or emotional deficit following cerebral infarction: Secondary | ICD-10-CM | POA: Diagnosis not present

## 2018-10-16 DIAGNOSIS — R569 Unspecified convulsions: Secondary | ICD-10-CM | POA: Diagnosis not present

## 2018-10-17 DIAGNOSIS — R569 Unspecified convulsions: Secondary | ICD-10-CM | POA: Diagnosis not present

## 2018-10-17 DIAGNOSIS — I69315 Cognitive social or emotional deficit following cerebral infarction: Secondary | ICD-10-CM | POA: Diagnosis not present

## 2018-10-18 DIAGNOSIS — R569 Unspecified convulsions: Secondary | ICD-10-CM | POA: Diagnosis not present

## 2018-10-18 DIAGNOSIS — I69315 Cognitive social or emotional deficit following cerebral infarction: Secondary | ICD-10-CM | POA: Diagnosis not present

## 2018-10-19 DIAGNOSIS — R569 Unspecified convulsions: Secondary | ICD-10-CM | POA: Diagnosis not present

## 2018-10-19 DIAGNOSIS — I69315 Cognitive social or emotional deficit following cerebral infarction: Secondary | ICD-10-CM | POA: Diagnosis not present

## 2018-10-20 DIAGNOSIS — I69315 Cognitive social or emotional deficit following cerebral infarction: Secondary | ICD-10-CM | POA: Diagnosis not present

## 2018-10-20 DIAGNOSIS — R569 Unspecified convulsions: Secondary | ICD-10-CM | POA: Diagnosis not present

## 2018-11-04 DIAGNOSIS — E860 Dehydration: Secondary | ICD-10-CM | POA: Diagnosis not present

## 2018-11-04 DIAGNOSIS — M069 Rheumatoid arthritis, unspecified: Secondary | ICD-10-CM | POA: Diagnosis not present

## 2018-11-04 DIAGNOSIS — F1410 Cocaine abuse, uncomplicated: Secondary | ICD-10-CM | POA: Diagnosis not present

## 2018-11-04 DIAGNOSIS — R001 Bradycardia, unspecified: Secondary | ICD-10-CM | POA: Diagnosis not present

## 2018-11-04 DIAGNOSIS — I10 Essential (primary) hypertension: Secondary | ICD-10-CM | POA: Diagnosis not present

## 2018-11-04 DIAGNOSIS — G9349 Other encephalopathy: Secondary | ICD-10-CM | POA: Diagnosis not present

## 2018-11-04 DIAGNOSIS — I639 Cerebral infarction, unspecified: Secondary | ICD-10-CM | POA: Diagnosis not present

## 2018-11-04 DIAGNOSIS — I679 Cerebrovascular disease, unspecified: Secondary | ICD-10-CM | POA: Diagnosis not present

## 2018-11-04 DIAGNOSIS — M6281 Muscle weakness (generalized): Secondary | ICD-10-CM | POA: Diagnosis not present

## 2018-11-04 DIAGNOSIS — I6381 Other cerebral infarction due to occlusion or stenosis of small artery: Secondary | ICD-10-CM | POA: Diagnosis not present

## 2018-11-04 DIAGNOSIS — R1310 Dysphagia, unspecified: Secondary | ICD-10-CM | POA: Diagnosis not present

## 2018-11-04 DIAGNOSIS — E7800 Pure hypercholesterolemia, unspecified: Secondary | ICD-10-CM | POA: Diagnosis not present

## 2018-11-04 DIAGNOSIS — M6282 Rhabdomyolysis: Secondary | ICD-10-CM | POA: Diagnosis not present

## 2018-11-04 DIAGNOSIS — I129 Hypertensive chronic kidney disease with stage 1 through stage 4 chronic kidney disease, or unspecified chronic kidney disease: Secondary | ICD-10-CM | POA: Diagnosis not present

## 2018-11-04 DIAGNOSIS — N179 Acute kidney failure, unspecified: Secondary | ICD-10-CM | POA: Diagnosis not present

## 2018-11-04 DIAGNOSIS — R2681 Unsteadiness on feet: Secondary | ICD-10-CM | POA: Diagnosis not present

## 2018-11-04 DIAGNOSIS — N183 Chronic kidney disease, stage 3 (moderate): Secondary | ICD-10-CM | POA: Diagnosis not present

## 2018-11-04 DIAGNOSIS — I160 Hypertensive urgency: Secondary | ICD-10-CM | POA: Diagnosis not present

## 2018-11-04 DIAGNOSIS — G9340 Encephalopathy, unspecified: Secondary | ICD-10-CM | POA: Diagnosis not present

## 2018-11-04 DIAGNOSIS — N390 Urinary tract infection, site not specified: Secondary | ICD-10-CM | POA: Diagnosis not present

## 2018-11-04 DIAGNOSIS — E1122 Type 2 diabetes mellitus with diabetic chronic kidney disease: Secondary | ICD-10-CM | POA: Diagnosis not present

## 2018-11-04 DIAGNOSIS — F319 Bipolar disorder, unspecified: Secondary | ICD-10-CM | POA: Diagnosis not present

## 2018-11-06 DIAGNOSIS — I639 Cerebral infarction, unspecified: Secondary | ICD-10-CM | POA: Diagnosis not present

## 2018-11-06 DIAGNOSIS — E7800 Pure hypercholesterolemia, unspecified: Secondary | ICD-10-CM | POA: Diagnosis not present

## 2018-11-06 DIAGNOSIS — I10 Essential (primary) hypertension: Secondary | ICD-10-CM | POA: Diagnosis not present

## 2018-11-06 DIAGNOSIS — F1410 Cocaine abuse, uncomplicated: Secondary | ICD-10-CM | POA: Diagnosis not present

## 2018-11-06 DIAGNOSIS — M069 Rheumatoid arthritis, unspecified: Secondary | ICD-10-CM | POA: Diagnosis not present

## 2018-11-06 DIAGNOSIS — N183 Chronic kidney disease, stage 3 (moderate): Secondary | ICD-10-CM | POA: Diagnosis not present

## 2018-11-06 DIAGNOSIS — E1122 Type 2 diabetes mellitus with diabetic chronic kidney disease: Secondary | ICD-10-CM | POA: Diagnosis not present

## 2018-11-06 DIAGNOSIS — F319 Bipolar disorder, unspecified: Secondary | ICD-10-CM | POA: Diagnosis not present

## 2018-11-11 DIAGNOSIS — M069 Rheumatoid arthritis, unspecified: Secondary | ICD-10-CM | POA: Diagnosis not present

## 2018-11-11 DIAGNOSIS — E1122 Type 2 diabetes mellitus with diabetic chronic kidney disease: Secondary | ICD-10-CM | POA: Diagnosis not present

## 2018-11-11 DIAGNOSIS — N183 Chronic kidney disease, stage 3 (moderate): Secondary | ICD-10-CM | POA: Diagnosis not present

## 2018-11-11 DIAGNOSIS — I10 Essential (primary) hypertension: Secondary | ICD-10-CM | POA: Diagnosis not present

## 2018-11-11 DIAGNOSIS — E7800 Pure hypercholesterolemia, unspecified: Secondary | ICD-10-CM | POA: Diagnosis not present

## 2018-11-11 DIAGNOSIS — I639 Cerebral infarction, unspecified: Secondary | ICD-10-CM | POA: Diagnosis not present

## 2018-11-11 DIAGNOSIS — F1410 Cocaine abuse, uncomplicated: Secondary | ICD-10-CM | POA: Diagnosis not present

## 2018-11-21 DIAGNOSIS — R262 Difficulty in walking, not elsewhere classified: Secondary | ICD-10-CM | POA: Diagnosis not present

## 2018-11-21 DIAGNOSIS — E1122 Type 2 diabetes mellitus with diabetic chronic kidney disease: Secondary | ICD-10-CM | POA: Diagnosis not present

## 2018-11-21 DIAGNOSIS — N183 Chronic kidney disease, stage 3 (moderate): Secondary | ICD-10-CM | POA: Diagnosis not present

## 2018-11-21 DIAGNOSIS — E7800 Pure hypercholesterolemia, unspecified: Secondary | ICD-10-CM | POA: Diagnosis not present

## 2018-11-21 DIAGNOSIS — F319 Bipolar disorder, unspecified: Secondary | ICD-10-CM | POA: Diagnosis not present

## 2018-11-21 DIAGNOSIS — F1410 Cocaine abuse, uncomplicated: Secondary | ICD-10-CM | POA: Diagnosis not present

## 2018-11-21 DIAGNOSIS — M069 Rheumatoid arthritis, unspecified: Secondary | ICD-10-CM | POA: Diagnosis not present

## 2018-11-21 DIAGNOSIS — I639 Cerebral infarction, unspecified: Secondary | ICD-10-CM | POA: Diagnosis not present

## 2018-11-21 DIAGNOSIS — I10 Essential (primary) hypertension: Secondary | ICD-10-CM | POA: Diagnosis not present

## 2018-12-19 DIAGNOSIS — I679 Cerebrovascular disease, unspecified: Secondary | ICD-10-CM | POA: Diagnosis not present

## 2018-12-19 DIAGNOSIS — N183 Chronic kidney disease, stage 3 (moderate): Secondary | ICD-10-CM | POA: Diagnosis not present

## 2018-12-19 DIAGNOSIS — E1122 Type 2 diabetes mellitus with diabetic chronic kidney disease: Secondary | ICD-10-CM | POA: Diagnosis not present

## 2018-12-30 DIAGNOSIS — F0391 Unspecified dementia with behavioral disturbance: Secondary | ICD-10-CM | POA: Diagnosis not present

## 2018-12-30 DIAGNOSIS — R451 Restlessness and agitation: Secondary | ICD-10-CM | POA: Diagnosis not present

## 2018-12-30 DIAGNOSIS — F39 Unspecified mood [affective] disorder: Secondary | ICD-10-CM | POA: Diagnosis not present

## 2019-01-09 DIAGNOSIS — E7800 Pure hypercholesterolemia, unspecified: Secondary | ICD-10-CM | POA: Diagnosis not present

## 2019-01-09 DIAGNOSIS — F319 Bipolar disorder, unspecified: Secondary | ICD-10-CM | POA: Diagnosis not present

## 2019-01-09 DIAGNOSIS — E119 Type 2 diabetes mellitus without complications: Secondary | ICD-10-CM | POA: Diagnosis not present

## 2019-01-09 DIAGNOSIS — I10 Essential (primary) hypertension: Secondary | ICD-10-CM | POA: Diagnosis not present

## 2019-01-09 DIAGNOSIS — I639 Cerebral infarction, unspecified: Secondary | ICD-10-CM | POA: Diagnosis not present

## 2019-01-09 DIAGNOSIS — R262 Difficulty in walking, not elsewhere classified: Secondary | ICD-10-CM | POA: Diagnosis not present

## 2019-01-09 DIAGNOSIS — M069 Rheumatoid arthritis, unspecified: Secondary | ICD-10-CM | POA: Diagnosis not present

## 2019-01-09 DIAGNOSIS — F1410 Cocaine abuse, uncomplicated: Secondary | ICD-10-CM | POA: Diagnosis not present

## 2019-01-13 DIAGNOSIS — F39 Unspecified mood [affective] disorder: Secondary | ICD-10-CM | POA: Diagnosis not present

## 2019-01-13 DIAGNOSIS — R451 Restlessness and agitation: Secondary | ICD-10-CM | POA: Diagnosis not present

## 2019-01-13 DIAGNOSIS — F0391 Unspecified dementia with behavioral disturbance: Secondary | ICD-10-CM | POA: Diagnosis not present

## 2019-01-14 DIAGNOSIS — R451 Restlessness and agitation: Secondary | ICD-10-CM | POA: Diagnosis not present

## 2019-01-14 DIAGNOSIS — I679 Cerebrovascular disease, unspecified: Secondary | ICD-10-CM | POA: Diagnosis not present

## 2019-01-14 DIAGNOSIS — I639 Cerebral infarction, unspecified: Secondary | ICD-10-CM | POA: Diagnosis not present

## 2019-01-14 DIAGNOSIS — R2681 Unsteadiness on feet: Secondary | ICD-10-CM | POA: Diagnosis not present

## 2019-01-14 DIAGNOSIS — M6282 Rhabdomyolysis: Secondary | ICD-10-CM | POA: Diagnosis not present

## 2019-01-14 DIAGNOSIS — F319 Bipolar disorder, unspecified: Secondary | ICD-10-CM | POA: Diagnosis not present

## 2019-01-14 DIAGNOSIS — E1122 Type 2 diabetes mellitus with diabetic chronic kidney disease: Secondary | ICD-10-CM | POA: Diagnosis not present

## 2019-01-14 DIAGNOSIS — G9340 Encephalopathy, unspecified: Secondary | ICD-10-CM | POA: Diagnosis not present

## 2019-01-14 DIAGNOSIS — N183 Chronic kidney disease, stage 3 (moderate): Secondary | ICD-10-CM | POA: Diagnosis not present

## 2019-01-14 DIAGNOSIS — F39 Unspecified mood [affective] disorder: Secondary | ICD-10-CM | POA: Diagnosis not present

## 2019-01-14 DIAGNOSIS — M6281 Muscle weakness (generalized): Secondary | ICD-10-CM | POA: Diagnosis not present

## 2019-01-14 DIAGNOSIS — F0391 Unspecified dementia with behavioral disturbance: Secondary | ICD-10-CM | POA: Diagnosis not present

## 2019-01-15 DIAGNOSIS — M6282 Rhabdomyolysis: Secondary | ICD-10-CM | POA: Diagnosis not present

## 2019-01-15 DIAGNOSIS — I639 Cerebral infarction, unspecified: Secondary | ICD-10-CM | POA: Diagnosis not present

## 2019-01-15 DIAGNOSIS — G9340 Encephalopathy, unspecified: Secondary | ICD-10-CM | POA: Diagnosis not present

## 2019-01-15 DIAGNOSIS — F319 Bipolar disorder, unspecified: Secondary | ICD-10-CM | POA: Diagnosis not present

## 2019-01-15 DIAGNOSIS — M6281 Muscle weakness (generalized): Secondary | ICD-10-CM | POA: Diagnosis not present

## 2019-01-15 DIAGNOSIS — N183 Chronic kidney disease, stage 3 (moderate): Secondary | ICD-10-CM | POA: Diagnosis not present

## 2019-01-15 DIAGNOSIS — I679 Cerebrovascular disease, unspecified: Secondary | ICD-10-CM | POA: Diagnosis not present

## 2019-01-15 DIAGNOSIS — E1122 Type 2 diabetes mellitus with diabetic chronic kidney disease: Secondary | ICD-10-CM | POA: Diagnosis not present

## 2019-01-15 DIAGNOSIS — R2681 Unsteadiness on feet: Secondary | ICD-10-CM | POA: Diagnosis not present

## 2019-01-16 DIAGNOSIS — N183 Chronic kidney disease, stage 3 (moderate): Secondary | ICD-10-CM | POA: Diagnosis not present

## 2019-01-16 DIAGNOSIS — M6281 Muscle weakness (generalized): Secondary | ICD-10-CM | POA: Diagnosis not present

## 2019-01-16 DIAGNOSIS — F319 Bipolar disorder, unspecified: Secondary | ICD-10-CM | POA: Diagnosis not present

## 2019-01-16 DIAGNOSIS — R2681 Unsteadiness on feet: Secondary | ICD-10-CM | POA: Diagnosis not present

## 2019-01-16 DIAGNOSIS — G9340 Encephalopathy, unspecified: Secondary | ICD-10-CM | POA: Diagnosis not present

## 2019-01-16 DIAGNOSIS — I639 Cerebral infarction, unspecified: Secondary | ICD-10-CM | POA: Diagnosis not present

## 2019-01-16 DIAGNOSIS — M6282 Rhabdomyolysis: Secondary | ICD-10-CM | POA: Diagnosis not present

## 2019-01-16 DIAGNOSIS — E1122 Type 2 diabetes mellitus with diabetic chronic kidney disease: Secondary | ICD-10-CM | POA: Diagnosis not present

## 2019-01-16 DIAGNOSIS — I679 Cerebrovascular disease, unspecified: Secondary | ICD-10-CM | POA: Diagnosis not present

## 2019-01-17 DIAGNOSIS — I639 Cerebral infarction, unspecified: Secondary | ICD-10-CM | POA: Diagnosis not present

## 2019-01-17 DIAGNOSIS — M6282 Rhabdomyolysis: Secondary | ICD-10-CM | POA: Diagnosis not present

## 2019-01-17 DIAGNOSIS — M6281 Muscle weakness (generalized): Secondary | ICD-10-CM | POA: Diagnosis not present

## 2019-01-17 DIAGNOSIS — G9340 Encephalopathy, unspecified: Secondary | ICD-10-CM | POA: Diagnosis not present

## 2019-01-17 DIAGNOSIS — N183 Chronic kidney disease, stage 3 (moderate): Secondary | ICD-10-CM | POA: Diagnosis not present

## 2019-01-17 DIAGNOSIS — F319 Bipolar disorder, unspecified: Secondary | ICD-10-CM | POA: Diagnosis not present

## 2019-01-17 DIAGNOSIS — R2681 Unsteadiness on feet: Secondary | ICD-10-CM | POA: Diagnosis not present

## 2019-01-17 DIAGNOSIS — E1122 Type 2 diabetes mellitus with diabetic chronic kidney disease: Secondary | ICD-10-CM | POA: Diagnosis not present

## 2019-01-17 DIAGNOSIS — I679 Cerebrovascular disease, unspecified: Secondary | ICD-10-CM | POA: Diagnosis not present

## 2019-01-20 DIAGNOSIS — M6282 Rhabdomyolysis: Secondary | ICD-10-CM | POA: Diagnosis not present

## 2019-01-20 DIAGNOSIS — E1122 Type 2 diabetes mellitus with diabetic chronic kidney disease: Secondary | ICD-10-CM | POA: Diagnosis not present

## 2019-01-20 DIAGNOSIS — I10 Essential (primary) hypertension: Secondary | ICD-10-CM | POA: Diagnosis not present

## 2019-01-20 DIAGNOSIS — E7800 Pure hypercholesterolemia, unspecified: Secondary | ICD-10-CM | POA: Diagnosis not present

## 2019-01-20 DIAGNOSIS — F1410 Cocaine abuse, uncomplicated: Secondary | ICD-10-CM | POA: Diagnosis not present

## 2019-01-20 DIAGNOSIS — M069 Rheumatoid arthritis, unspecified: Secondary | ICD-10-CM | POA: Diagnosis not present

## 2019-01-20 DIAGNOSIS — R2681 Unsteadiness on feet: Secondary | ICD-10-CM | POA: Diagnosis not present

## 2019-01-20 DIAGNOSIS — R262 Difficulty in walking, not elsewhere classified: Secondary | ICD-10-CM | POA: Diagnosis not present

## 2019-01-20 DIAGNOSIS — I639 Cerebral infarction, unspecified: Secondary | ICD-10-CM | POA: Diagnosis not present

## 2019-01-20 DIAGNOSIS — F319 Bipolar disorder, unspecified: Secondary | ICD-10-CM | POA: Diagnosis not present

## 2019-01-20 DIAGNOSIS — M6281 Muscle weakness (generalized): Secondary | ICD-10-CM | POA: Diagnosis not present

## 2019-01-20 DIAGNOSIS — N183 Chronic kidney disease, stage 3 (moderate): Secondary | ICD-10-CM | POA: Diagnosis not present

## 2019-01-20 DIAGNOSIS — G9340 Encephalopathy, unspecified: Secondary | ICD-10-CM | POA: Diagnosis not present

## 2019-01-20 DIAGNOSIS — I679 Cerebrovascular disease, unspecified: Secondary | ICD-10-CM | POA: Diagnosis not present

## 2019-01-21 DIAGNOSIS — N183 Chronic kidney disease, stage 3 (moderate): Secondary | ICD-10-CM | POA: Diagnosis not present

## 2019-01-21 DIAGNOSIS — R2681 Unsteadiness on feet: Secondary | ICD-10-CM | POA: Diagnosis not present

## 2019-01-21 DIAGNOSIS — G9340 Encephalopathy, unspecified: Secondary | ICD-10-CM | POA: Diagnosis not present

## 2019-01-21 DIAGNOSIS — M6282 Rhabdomyolysis: Secondary | ICD-10-CM | POA: Diagnosis not present

## 2019-01-21 DIAGNOSIS — M6281 Muscle weakness (generalized): Secondary | ICD-10-CM | POA: Diagnosis not present

## 2019-01-21 DIAGNOSIS — E1122 Type 2 diabetes mellitus with diabetic chronic kidney disease: Secondary | ICD-10-CM | POA: Diagnosis not present

## 2019-01-21 DIAGNOSIS — I639 Cerebral infarction, unspecified: Secondary | ICD-10-CM | POA: Diagnosis not present

## 2019-01-21 DIAGNOSIS — I679 Cerebrovascular disease, unspecified: Secondary | ICD-10-CM | POA: Diagnosis not present

## 2019-01-21 DIAGNOSIS — F319 Bipolar disorder, unspecified: Secondary | ICD-10-CM | POA: Diagnosis not present

## 2019-01-22 DIAGNOSIS — N183 Chronic kidney disease, stage 3 (moderate): Secondary | ICD-10-CM | POA: Diagnosis not present

## 2019-01-22 DIAGNOSIS — F319 Bipolar disorder, unspecified: Secondary | ICD-10-CM | POA: Diagnosis not present

## 2019-01-22 DIAGNOSIS — I679 Cerebrovascular disease, unspecified: Secondary | ICD-10-CM | POA: Diagnosis not present

## 2019-01-22 DIAGNOSIS — I639 Cerebral infarction, unspecified: Secondary | ICD-10-CM | POA: Diagnosis not present

## 2019-01-22 DIAGNOSIS — G9340 Encephalopathy, unspecified: Secondary | ICD-10-CM | POA: Diagnosis not present

## 2019-01-22 DIAGNOSIS — M6281 Muscle weakness (generalized): Secondary | ICD-10-CM | POA: Diagnosis not present

## 2019-01-22 DIAGNOSIS — R2681 Unsteadiness on feet: Secondary | ICD-10-CM | POA: Diagnosis not present

## 2019-01-22 DIAGNOSIS — M6282 Rhabdomyolysis: Secondary | ICD-10-CM | POA: Diagnosis not present

## 2019-01-22 DIAGNOSIS — E1122 Type 2 diabetes mellitus with diabetic chronic kidney disease: Secondary | ICD-10-CM | POA: Diagnosis not present

## 2019-01-27 DIAGNOSIS — R451 Restlessness and agitation: Secondary | ICD-10-CM | POA: Diagnosis not present

## 2019-01-27 DIAGNOSIS — F0391 Unspecified dementia with behavioral disturbance: Secondary | ICD-10-CM | POA: Diagnosis not present

## 2019-01-27 DIAGNOSIS — F39 Unspecified mood [affective] disorder: Secondary | ICD-10-CM | POA: Diagnosis not present

## 2019-01-28 DIAGNOSIS — F17210 Nicotine dependence, cigarettes, uncomplicated: Secondary | ICD-10-CM | POA: Diagnosis not present

## 2019-01-28 DIAGNOSIS — M6282 Rhabdomyolysis: Secondary | ICD-10-CM | POA: Diagnosis not present

## 2019-01-28 DIAGNOSIS — Z79899 Other long term (current) drug therapy: Secondary | ICD-10-CM | POA: Diagnosis not present

## 2019-01-28 DIAGNOSIS — I160 Hypertensive urgency: Secondary | ICD-10-CM | POA: Diagnosis not present

## 2019-01-28 DIAGNOSIS — G459 Transient cerebral ischemic attack, unspecified: Secondary | ICD-10-CM | POA: Diagnosis not present

## 2019-01-28 DIAGNOSIS — D649 Anemia, unspecified: Secondary | ICD-10-CM | POA: Diagnosis not present

## 2019-01-28 DIAGNOSIS — I69398 Other sequelae of cerebral infarction: Secondary | ICD-10-CM | POA: Diagnosis not present

## 2019-01-28 DIAGNOSIS — I63532 Cerebral infarction due to unspecified occlusion or stenosis of left posterior cerebral artery: Secondary | ICD-10-CM | POA: Diagnosis not present

## 2019-01-28 DIAGNOSIS — G9340 Encephalopathy, unspecified: Secondary | ICD-10-CM | POA: Diagnosis not present

## 2019-01-28 DIAGNOSIS — I10 Essential (primary) hypertension: Secondary | ICD-10-CM | POA: Diagnosis not present

## 2019-01-28 DIAGNOSIS — I639 Cerebral infarction, unspecified: Secondary | ICD-10-CM | POA: Diagnosis not present

## 2019-01-28 DIAGNOSIS — F419 Anxiety disorder, unspecified: Secondary | ICD-10-CM | POA: Diagnosis not present

## 2019-01-28 DIAGNOSIS — E119 Type 2 diabetes mellitus without complications: Secondary | ICD-10-CM | POA: Diagnosis not present

## 2019-01-28 DIAGNOSIS — R2681 Unsteadiness on feet: Secondary | ICD-10-CM | POA: Diagnosis not present

## 2019-01-28 DIAGNOSIS — S0083XA Contusion of other part of head, initial encounter: Secondary | ICD-10-CM | POA: Diagnosis not present

## 2019-01-28 DIAGNOSIS — E785 Hyperlipidemia, unspecified: Secondary | ICD-10-CM | POA: Diagnosis not present

## 2019-01-28 DIAGNOSIS — S0191XA Laceration without foreign body of unspecified part of head, initial encounter: Secondary | ICD-10-CM | POA: Diagnosis not present

## 2019-01-28 DIAGNOSIS — E1122 Type 2 diabetes mellitus with diabetic chronic kidney disease: Secondary | ICD-10-CM | POA: Diagnosis not present

## 2019-01-28 DIAGNOSIS — F39 Unspecified mood [affective] disorder: Secondary | ICD-10-CM | POA: Diagnosis not present

## 2019-01-28 DIAGNOSIS — F319 Bipolar disorder, unspecified: Secondary | ICD-10-CM | POA: Diagnosis not present

## 2019-01-28 DIAGNOSIS — M6281 Muscle weakness (generalized): Secondary | ICD-10-CM | POA: Diagnosis not present

## 2019-01-28 DIAGNOSIS — S299XXA Unspecified injury of thorax, initial encounter: Secondary | ICD-10-CM | POA: Diagnosis not present

## 2019-01-28 DIAGNOSIS — R509 Fever, unspecified: Secondary | ICD-10-CM | POA: Diagnosis not present

## 2019-01-28 DIAGNOSIS — G9349 Other encephalopathy: Secondary | ICD-10-CM | POA: Diagnosis not present

## 2019-01-28 DIAGNOSIS — S0990XA Unspecified injury of head, initial encounter: Secondary | ICD-10-CM | POA: Diagnosis not present

## 2019-01-28 DIAGNOSIS — S199XXA Unspecified injury of neck, initial encounter: Secondary | ICD-10-CM | POA: Diagnosis not present

## 2019-01-28 DIAGNOSIS — Z043 Encounter for examination and observation following other accident: Secondary | ICD-10-CM | POA: Diagnosis not present

## 2019-01-28 DIAGNOSIS — F0391 Unspecified dementia with behavioral disturbance: Secondary | ICD-10-CM | POA: Diagnosis not present

## 2019-01-28 DIAGNOSIS — I679 Cerebrovascular disease, unspecified: Secondary | ICD-10-CM | POA: Diagnosis not present

## 2019-01-28 DIAGNOSIS — W19XXXA Unspecified fall, initial encounter: Secondary | ICD-10-CM | POA: Diagnosis not present

## 2019-01-28 DIAGNOSIS — F1410 Cocaine abuse, uncomplicated: Secondary | ICD-10-CM | POA: Diagnosis not present

## 2019-01-31 DIAGNOSIS — M6281 Muscle weakness (generalized): Secondary | ICD-10-CM | POA: Diagnosis not present

## 2019-01-31 DIAGNOSIS — I5032 Chronic diastolic (congestive) heart failure: Secondary | ICD-10-CM | POA: Diagnosis not present

## 2019-01-31 DIAGNOSIS — F0151 Vascular dementia with behavioral disturbance: Secondary | ICD-10-CM | POA: Diagnosis not present

## 2019-01-31 DIAGNOSIS — E860 Dehydration: Secondary | ICD-10-CM | POA: Diagnosis not present

## 2019-01-31 DIAGNOSIS — G9349 Other encephalopathy: Secondary | ICD-10-CM | POA: Diagnosis not present

## 2019-01-31 DIAGNOSIS — E870 Hyperosmolality and hypernatremia: Secondary | ICD-10-CM | POA: Diagnosis not present

## 2019-01-31 DIAGNOSIS — R2681 Unsteadiness on feet: Secondary | ICD-10-CM | POA: Diagnosis not present

## 2019-01-31 DIAGNOSIS — I679 Cerebrovascular disease, unspecified: Secondary | ICD-10-CM | POA: Diagnosis not present

## 2019-01-31 DIAGNOSIS — G9340 Encephalopathy, unspecified: Secondary | ICD-10-CM | POA: Diagnosis not present

## 2019-01-31 DIAGNOSIS — N390 Urinary tract infection, site not specified: Secondary | ICD-10-CM | POA: Diagnosis not present

## 2019-01-31 DIAGNOSIS — R451 Restlessness and agitation: Secondary | ICD-10-CM | POA: Diagnosis not present

## 2019-01-31 DIAGNOSIS — Z79899 Other long term (current) drug therapy: Secondary | ICD-10-CM | POA: Diagnosis not present

## 2019-01-31 DIAGNOSIS — E1122 Type 2 diabetes mellitus with diabetic chronic kidney disease: Secondary | ICD-10-CM | POA: Diagnosis not present

## 2019-01-31 DIAGNOSIS — F39 Unspecified mood [affective] disorder: Secondary | ICD-10-CM | POA: Diagnosis not present

## 2019-01-31 DIAGNOSIS — G9341 Metabolic encephalopathy: Secondary | ICD-10-CM | POA: Diagnosis not present

## 2019-01-31 DIAGNOSIS — I160 Hypertensive urgency: Secondary | ICD-10-CM | POA: Diagnosis not present

## 2019-01-31 DIAGNOSIS — I248 Other forms of acute ischemic heart disease: Secondary | ICD-10-CM | POA: Diagnosis not present

## 2019-01-31 DIAGNOSIS — I69398 Other sequelae of cerebral infarction: Secondary | ICD-10-CM | POA: Diagnosis not present

## 2019-01-31 DIAGNOSIS — I10 Essential (primary) hypertension: Secondary | ICD-10-CM | POA: Diagnosis not present

## 2019-01-31 DIAGNOSIS — S0083XA Contusion of other part of head, initial encounter: Secondary | ICD-10-CM | POA: Diagnosis not present

## 2019-01-31 DIAGNOSIS — R634 Abnormal weight loss: Secondary | ICD-10-CM | POA: Diagnosis not present

## 2019-01-31 DIAGNOSIS — F17210 Nicotine dependence, cigarettes, uncomplicated: Secondary | ICD-10-CM | POA: Diagnosis not present

## 2019-01-31 DIAGNOSIS — F19239 Other psychoactive substance dependence with withdrawal, unspecified: Secondary | ICD-10-CM | POA: Diagnosis not present

## 2019-01-31 DIAGNOSIS — Z20828 Contact with and (suspected) exposure to other viral communicable diseases: Secondary | ICD-10-CM | POA: Diagnosis not present

## 2019-01-31 DIAGNOSIS — R6889 Other general symptoms and signs: Secondary | ICD-10-CM | POA: Diagnosis not present

## 2019-01-31 DIAGNOSIS — N183 Chronic kidney disease, stage 3 (moderate): Secondary | ICD-10-CM | POA: Diagnosis not present

## 2019-01-31 DIAGNOSIS — E119 Type 2 diabetes mellitus without complications: Secondary | ICD-10-CM | POA: Diagnosis not present

## 2019-01-31 DIAGNOSIS — M6282 Rhabdomyolysis: Secondary | ICD-10-CM | POA: Diagnosis not present

## 2019-01-31 DIAGNOSIS — I639 Cerebral infarction, unspecified: Secondary | ICD-10-CM | POA: Diagnosis not present

## 2019-01-31 DIAGNOSIS — F319 Bipolar disorder, unspecified: Secondary | ICD-10-CM | POA: Diagnosis not present

## 2019-01-31 DIAGNOSIS — E785 Hyperlipidemia, unspecified: Secondary | ICD-10-CM | POA: Diagnosis not present

## 2019-01-31 DIAGNOSIS — F419 Anxiety disorder, unspecified: Secondary | ICD-10-CM | POA: Diagnosis not present

## 2019-01-31 DIAGNOSIS — R4182 Altered mental status, unspecified: Secondary | ICD-10-CM | POA: Diagnosis not present

## 2019-01-31 DIAGNOSIS — R509 Fever, unspecified: Secondary | ICD-10-CM | POA: Diagnosis not present

## 2019-01-31 DIAGNOSIS — D649 Anemia, unspecified: Secondary | ICD-10-CM | POA: Diagnosis not present

## 2019-01-31 DIAGNOSIS — N179 Acute kidney failure, unspecified: Secondary | ICD-10-CM | POA: Diagnosis not present

## 2019-01-31 DIAGNOSIS — F1410 Cocaine abuse, uncomplicated: Secondary | ICD-10-CM | POA: Diagnosis not present

## 2019-01-31 DIAGNOSIS — A419 Sepsis, unspecified organism: Secondary | ICD-10-CM | POA: Diagnosis not present

## 2019-01-31 DIAGNOSIS — R262 Difficulty in walking, not elsewhere classified: Secondary | ICD-10-CM | POA: Diagnosis not present

## 2019-02-10 DIAGNOSIS — F39 Unspecified mood [affective] disorder: Secondary | ICD-10-CM | POA: Diagnosis not present

## 2019-02-10 DIAGNOSIS — F319 Bipolar disorder, unspecified: Secondary | ICD-10-CM | POA: Diagnosis not present

## 2019-02-10 DIAGNOSIS — E785 Hyperlipidemia, unspecified: Secondary | ICD-10-CM | POA: Diagnosis not present

## 2019-02-10 DIAGNOSIS — R451 Restlessness and agitation: Secondary | ICD-10-CM | POA: Diagnosis not present

## 2019-02-10 DIAGNOSIS — N183 Chronic kidney disease, stage 3 (moderate): Secondary | ICD-10-CM | POA: Diagnosis not present

## 2019-02-10 DIAGNOSIS — F0151 Vascular dementia with behavioral disturbance: Secondary | ICD-10-CM | POA: Diagnosis not present

## 2019-02-17 DIAGNOSIS — F1410 Cocaine abuse, uncomplicated: Secondary | ICD-10-CM | POA: Diagnosis not present

## 2019-02-17 DIAGNOSIS — E1122 Type 2 diabetes mellitus with diabetic chronic kidney disease: Secondary | ICD-10-CM | POA: Diagnosis not present

## 2019-02-17 DIAGNOSIS — I639 Cerebral infarction, unspecified: Secondary | ICD-10-CM | POA: Diagnosis not present

## 2019-02-17 DIAGNOSIS — F319 Bipolar disorder, unspecified: Secondary | ICD-10-CM | POA: Diagnosis not present

## 2019-02-17 DIAGNOSIS — R262 Difficulty in walking, not elsewhere classified: Secondary | ICD-10-CM | POA: Diagnosis not present

## 2019-02-17 DIAGNOSIS — I10 Essential (primary) hypertension: Secondary | ICD-10-CM | POA: Diagnosis not present

## 2019-02-21 DIAGNOSIS — R262 Difficulty in walking, not elsewhere classified: Secondary | ICD-10-CM | POA: Diagnosis not present

## 2019-02-21 DIAGNOSIS — E1122 Type 2 diabetes mellitus with diabetic chronic kidney disease: Secondary | ICD-10-CM | POA: Diagnosis not present

## 2019-02-21 DIAGNOSIS — F319 Bipolar disorder, unspecified: Secondary | ICD-10-CM | POA: Diagnosis not present

## 2019-02-21 DIAGNOSIS — N183 Chronic kidney disease, stage 3 (moderate): Secondary | ICD-10-CM | POA: Diagnosis not present

## 2019-02-21 DIAGNOSIS — I639 Cerebral infarction, unspecified: Secondary | ICD-10-CM | POA: Diagnosis not present

## 2019-02-21 DIAGNOSIS — I10 Essential (primary) hypertension: Secondary | ICD-10-CM | POA: Diagnosis not present

## 2019-02-21 DIAGNOSIS — F1410 Cocaine abuse, uncomplicated: Secondary | ICD-10-CM | POA: Diagnosis not present

## 2019-03-05 DIAGNOSIS — N390 Urinary tract infection, site not specified: Secondary | ICD-10-CM | POA: Diagnosis not present

## 2019-03-05 DIAGNOSIS — F419 Anxiety disorder, unspecified: Secondary | ICD-10-CM | POA: Diagnosis not present

## 2019-03-05 DIAGNOSIS — R2681 Unsteadiness on feet: Secondary | ICD-10-CM | POA: Diagnosis not present

## 2019-03-05 DIAGNOSIS — E870 Hyperosmolality and hypernatremia: Secondary | ICD-10-CM | POA: Diagnosis not present

## 2019-03-05 DIAGNOSIS — R509 Fever, unspecified: Secondary | ICD-10-CM | POA: Diagnosis not present

## 2019-03-05 DIAGNOSIS — E860 Dehydration: Secondary | ICD-10-CM | POA: Diagnosis not present

## 2019-03-05 DIAGNOSIS — M6281 Muscle weakness (generalized): Secondary | ICD-10-CM | POA: Diagnosis not present

## 2019-03-05 DIAGNOSIS — Z20828 Contact with and (suspected) exposure to other viral communicable diseases: Secondary | ICD-10-CM | POA: Diagnosis not present

## 2019-03-05 DIAGNOSIS — G9341 Metabolic encephalopathy: Secondary | ICD-10-CM | POA: Diagnosis not present

## 2019-03-05 DIAGNOSIS — R634 Abnormal weight loss: Secondary | ICD-10-CM | POA: Diagnosis not present

## 2019-03-05 DIAGNOSIS — R4182 Altered mental status, unspecified: Secondary | ICD-10-CM | POA: Diagnosis not present

## 2019-03-05 DIAGNOSIS — I679 Cerebrovascular disease, unspecified: Secondary | ICD-10-CM | POA: Diagnosis not present

## 2019-03-05 DIAGNOSIS — M6282 Rhabdomyolysis: Secondary | ICD-10-CM | POA: Diagnosis not present

## 2019-03-05 DIAGNOSIS — F319 Bipolar disorder, unspecified: Secondary | ICD-10-CM | POA: Diagnosis not present

## 2019-03-05 DIAGNOSIS — R6889 Other general symptoms and signs: Secondary | ICD-10-CM | POA: Diagnosis not present

## 2019-03-05 DIAGNOSIS — F19239 Other psychoactive substance dependence with withdrawal, unspecified: Secondary | ICD-10-CM | POA: Diagnosis not present

## 2019-03-05 DIAGNOSIS — F0151 Vascular dementia with behavioral disturbance: Secondary | ICD-10-CM | POA: Diagnosis not present

## 2019-03-05 DIAGNOSIS — I248 Other forms of acute ischemic heart disease: Secondary | ICD-10-CM | POA: Diagnosis not present

## 2019-03-05 DIAGNOSIS — N179 Acute kidney failure, unspecified: Secondary | ICD-10-CM | POA: Diagnosis not present

## 2019-03-05 DIAGNOSIS — I5032 Chronic diastolic (congestive) heart failure: Secondary | ICD-10-CM | POA: Diagnosis not present

## 2019-03-05 DIAGNOSIS — G9349 Other encephalopathy: Secondary | ICD-10-CM | POA: Diagnosis not present

## 2019-03-05 DIAGNOSIS — A419 Sepsis, unspecified organism: Secondary | ICD-10-CM | POA: Diagnosis not present

## 2019-03-05 DIAGNOSIS — E1122 Type 2 diabetes mellitus with diabetic chronic kidney disease: Secondary | ICD-10-CM | POA: Diagnosis not present

## 2019-03-05 DIAGNOSIS — G9340 Encephalopathy, unspecified: Secondary | ICD-10-CM | POA: Diagnosis not present

## 2019-03-10 DIAGNOSIS — R634 Abnormal weight loss: Secondary | ICD-10-CM | POA: Diagnosis not present

## 2019-03-11 DIAGNOSIS — G9349 Other encephalopathy: Secondary | ICD-10-CM | POA: Diagnosis not present

## 2019-03-11 DIAGNOSIS — E870 Hyperosmolality and hypernatremia: Secondary | ICD-10-CM | POA: Diagnosis not present

## 2019-03-11 DIAGNOSIS — F0151 Vascular dementia with behavioral disturbance: Secondary | ICD-10-CM | POA: Diagnosis not present

## 2019-03-11 DIAGNOSIS — I5032 Chronic diastolic (congestive) heart failure: Secondary | ICD-10-CM | POA: Diagnosis not present

## 2019-03-11 DIAGNOSIS — M6282 Rhabdomyolysis: Secondary | ICD-10-CM | POA: Diagnosis not present

## 2019-03-11 DIAGNOSIS — N179 Acute kidney failure, unspecified: Secondary | ICD-10-CM | POA: Diagnosis not present

## 2019-03-11 DIAGNOSIS — G9341 Metabolic encephalopathy: Secondary | ICD-10-CM | POA: Diagnosis not present

## 2019-03-11 DIAGNOSIS — I248 Other forms of acute ischemic heart disease: Secondary | ICD-10-CM | POA: Diagnosis not present

## 2019-03-11 DIAGNOSIS — N390 Urinary tract infection, site not specified: Secondary | ICD-10-CM | POA: Diagnosis not present

## 2019-03-12 DIAGNOSIS — M6282 Rhabdomyolysis: Secondary | ICD-10-CM | POA: Diagnosis not present

## 2019-03-12 DIAGNOSIS — F0151 Vascular dementia with behavioral disturbance: Secondary | ICD-10-CM | POA: Diagnosis not present

## 2019-03-12 DIAGNOSIS — R509 Fever, unspecified: Secondary | ICD-10-CM | POA: Diagnosis not present

## 2019-03-12 DIAGNOSIS — R4182 Altered mental status, unspecified: Secondary | ICD-10-CM | POA: Diagnosis not present

## 2019-03-12 DIAGNOSIS — F19239 Other psychoactive substance dependence with withdrawal, unspecified: Secondary | ICD-10-CM | POA: Diagnosis not present

## 2019-03-12 DIAGNOSIS — G9349 Other encephalopathy: Secondary | ICD-10-CM | POA: Diagnosis not present

## 2019-03-12 DIAGNOSIS — K5900 Constipation, unspecified: Secondary | ICD-10-CM | POA: Diagnosis not present

## 2019-03-12 DIAGNOSIS — D696 Thrombocytopenia, unspecified: Secondary | ICD-10-CM | POA: Diagnosis not present

## 2019-03-12 DIAGNOSIS — Z20828 Contact with and (suspected) exposure to other viral communicable diseases: Secondary | ICD-10-CM | POA: Diagnosis not present

## 2019-03-12 DIAGNOSIS — N179 Acute kidney failure, unspecified: Secondary | ICD-10-CM | POA: Diagnosis not present

## 2019-03-12 DIAGNOSIS — G9341 Metabolic encephalopathy: Secondary | ICD-10-CM | POA: Diagnosis not present

## 2019-03-12 DIAGNOSIS — G9340 Encephalopathy, unspecified: Secondary | ICD-10-CM | POA: Diagnosis not present

## 2019-03-12 DIAGNOSIS — K56609 Unspecified intestinal obstruction, unspecified as to partial versus complete obstruction: Secondary | ICD-10-CM | POA: Diagnosis not present

## 2019-03-12 DIAGNOSIS — R6889 Other general symptoms and signs: Secondary | ICD-10-CM | POA: Diagnosis not present

## 2019-03-12 DIAGNOSIS — N390 Urinary tract infection, site not specified: Secondary | ICD-10-CM | POA: Diagnosis not present

## 2019-03-12 DIAGNOSIS — E860 Dehydration: Secondary | ICD-10-CM | POA: Diagnosis not present

## 2019-03-12 DIAGNOSIS — J189 Pneumonia, unspecified organism: Secondary | ICD-10-CM | POA: Diagnosis not present

## 2019-03-12 DIAGNOSIS — I248 Other forms of acute ischemic heart disease: Secondary | ICD-10-CM | POA: Diagnosis not present

## 2019-03-12 DIAGNOSIS — E870 Hyperosmolality and hypernatremia: Secondary | ICD-10-CM | POA: Diagnosis not present

## 2019-03-12 DIAGNOSIS — R7881 Bacteremia: Secondary | ICD-10-CM | POA: Diagnosis not present

## 2019-03-12 DIAGNOSIS — I5032 Chronic diastolic (congestive) heart failure: Secondary | ICD-10-CM | POA: Diagnosis not present

## 2019-03-12 DIAGNOSIS — A419 Sepsis, unspecified organism: Secondary | ICD-10-CM | POA: Diagnosis not present

## 2019-03-12 DIAGNOSIS — D649 Anemia, unspecified: Secondary | ICD-10-CM | POA: Diagnosis not present

## 2019-03-13 DIAGNOSIS — E870 Hyperosmolality and hypernatremia: Secondary | ICD-10-CM | POA: Diagnosis not present

## 2019-03-13 DIAGNOSIS — R509 Fever, unspecified: Secondary | ICD-10-CM | POA: Diagnosis not present

## 2019-03-13 DIAGNOSIS — N179 Acute kidney failure, unspecified: Secondary | ICD-10-CM | POA: Diagnosis not present

## 2019-03-13 DIAGNOSIS — G9340 Encephalopathy, unspecified: Secondary | ICD-10-CM | POA: Diagnosis not present

## 2019-03-14 DIAGNOSIS — E870 Hyperosmolality and hypernatremia: Secondary | ICD-10-CM | POA: Diagnosis not present

## 2019-03-14 DIAGNOSIS — A419 Sepsis, unspecified organism: Secondary | ICD-10-CM | POA: Diagnosis not present

## 2019-03-14 DIAGNOSIS — R509 Fever, unspecified: Secondary | ICD-10-CM | POA: Diagnosis not present

## 2019-03-14 DIAGNOSIS — R6889 Other general symptoms and signs: Secondary | ICD-10-CM | POA: Diagnosis not present

## 2019-03-14 DIAGNOSIS — G9340 Encephalopathy, unspecified: Secondary | ICD-10-CM | POA: Diagnosis not present

## 2019-03-14 DIAGNOSIS — N179 Acute kidney failure, unspecified: Secondary | ICD-10-CM | POA: Diagnosis not present

## 2019-03-15 DIAGNOSIS — G9340 Encephalopathy, unspecified: Secondary | ICD-10-CM | POA: Diagnosis not present

## 2019-03-15 DIAGNOSIS — N179 Acute kidney failure, unspecified: Secondary | ICD-10-CM | POA: Diagnosis not present

## 2019-03-15 DIAGNOSIS — E870 Hyperosmolality and hypernatremia: Secondary | ICD-10-CM | POA: Diagnosis not present

## 2019-03-15 DIAGNOSIS — R509 Fever, unspecified: Secondary | ICD-10-CM | POA: Diagnosis not present

## 2019-03-15 DIAGNOSIS — D649 Anemia, unspecified: Secondary | ICD-10-CM | POA: Diagnosis not present

## 2019-03-15 DIAGNOSIS — D696 Thrombocytopenia, unspecified: Secondary | ICD-10-CM | POA: Diagnosis not present

## 2019-03-16 DIAGNOSIS — K56609 Unspecified intestinal obstruction, unspecified as to partial versus complete obstruction: Secondary | ICD-10-CM | POA: Diagnosis not present

## 2019-03-16 DIAGNOSIS — R509 Fever, unspecified: Secondary | ICD-10-CM | POA: Diagnosis not present

## 2019-03-16 DIAGNOSIS — A419 Sepsis, unspecified organism: Secondary | ICD-10-CM | POA: Diagnosis not present

## 2019-03-16 DIAGNOSIS — K5900 Constipation, unspecified: Secondary | ICD-10-CM | POA: Diagnosis not present

## 2019-03-16 DIAGNOSIS — N179 Acute kidney failure, unspecified: Secondary | ICD-10-CM | POA: Diagnosis not present

## 2019-03-16 DIAGNOSIS — E870 Hyperosmolality and hypernatremia: Secondary | ICD-10-CM | POA: Diagnosis not present

## 2019-03-16 DIAGNOSIS — G9340 Encephalopathy, unspecified: Secondary | ICD-10-CM | POA: Diagnosis not present

## 2019-03-17 DIAGNOSIS — N179 Acute kidney failure, unspecified: Secondary | ICD-10-CM | POA: Diagnosis not present

## 2019-03-17 DIAGNOSIS — R509 Fever, unspecified: Secondary | ICD-10-CM | POA: Diagnosis not present

## 2019-03-17 DIAGNOSIS — E870 Hyperosmolality and hypernatremia: Secondary | ICD-10-CM | POA: Diagnosis not present

## 2019-03-17 DIAGNOSIS — G9340 Encephalopathy, unspecified: Secondary | ICD-10-CM | POA: Diagnosis not present

## 2019-03-18 DIAGNOSIS — N179 Acute kidney failure, unspecified: Secondary | ICD-10-CM | POA: Diagnosis not present

## 2019-03-18 DIAGNOSIS — F0151 Vascular dementia with behavioral disturbance: Secondary | ICD-10-CM | POA: Diagnosis not present

## 2019-03-18 DIAGNOSIS — G9340 Encephalopathy, unspecified: Secondary | ICD-10-CM | POA: Diagnosis not present

## 2019-03-18 DIAGNOSIS — R509 Fever, unspecified: Secondary | ICD-10-CM | POA: Diagnosis not present

## 2019-03-18 DIAGNOSIS — E870 Hyperosmolality and hypernatremia: Secondary | ICD-10-CM | POA: Diagnosis not present

## 2019-03-19 DIAGNOSIS — E870 Hyperosmolality and hypernatremia: Secondary | ICD-10-CM | POA: Diagnosis not present

## 2019-03-19 DIAGNOSIS — F0151 Vascular dementia with behavioral disturbance: Secondary | ICD-10-CM | POA: Diagnosis not present

## 2019-03-19 DIAGNOSIS — G9340 Encephalopathy, unspecified: Secondary | ICD-10-CM | POA: Diagnosis not present

## 2019-03-19 DIAGNOSIS — R509 Fever, unspecified: Secondary | ICD-10-CM | POA: Diagnosis not present

## 2019-03-19 DIAGNOSIS — N179 Acute kidney failure, unspecified: Secondary | ICD-10-CM | POA: Diagnosis not present

## 2019-03-20 DIAGNOSIS — F0151 Vascular dementia with behavioral disturbance: Secondary | ICD-10-CM | POA: Diagnosis not present

## 2019-03-20 DIAGNOSIS — E870 Hyperosmolality and hypernatremia: Secondary | ICD-10-CM | POA: Diagnosis not present

## 2019-03-20 DIAGNOSIS — N179 Acute kidney failure, unspecified: Secondary | ICD-10-CM | POA: Diagnosis not present

## 2019-03-20 DIAGNOSIS — A419 Sepsis, unspecified organism: Secondary | ICD-10-CM | POA: Diagnosis not present

## 2019-03-20 DIAGNOSIS — G9340 Encephalopathy, unspecified: Secondary | ICD-10-CM | POA: Diagnosis not present

## 2019-03-20 DIAGNOSIS — J189 Pneumonia, unspecified organism: Secondary | ICD-10-CM | POA: Diagnosis not present

## 2019-03-20 DIAGNOSIS — R509 Fever, unspecified: Secondary | ICD-10-CM | POA: Diagnosis not present

## 2019-03-21 DIAGNOSIS — F0151 Vascular dementia with behavioral disturbance: Secondary | ICD-10-CM | POA: Diagnosis not present

## 2019-03-21 DIAGNOSIS — R509 Fever, unspecified: Secondary | ICD-10-CM | POA: Diagnosis not present

## 2019-03-21 DIAGNOSIS — E870 Hyperosmolality and hypernatremia: Secondary | ICD-10-CM | POA: Diagnosis not present

## 2019-03-21 DIAGNOSIS — G9340 Encephalopathy, unspecified: Secondary | ICD-10-CM | POA: Diagnosis not present

## 2019-03-21 DIAGNOSIS — N179 Acute kidney failure, unspecified: Secondary | ICD-10-CM | POA: Diagnosis not present

## 2019-03-22 DIAGNOSIS — R509 Fever, unspecified: Secondary | ICD-10-CM | POA: Diagnosis not present

## 2019-03-22 DIAGNOSIS — F0151 Vascular dementia with behavioral disturbance: Secondary | ICD-10-CM | POA: Diagnosis not present

## 2019-03-22 DIAGNOSIS — E870 Hyperosmolality and hypernatremia: Secondary | ICD-10-CM | POA: Diagnosis not present

## 2019-03-22 DIAGNOSIS — N179 Acute kidney failure, unspecified: Secondary | ICD-10-CM | POA: Diagnosis not present

## 2019-03-22 DIAGNOSIS — G9340 Encephalopathy, unspecified: Secondary | ICD-10-CM | POA: Diagnosis not present

## 2019-03-23 DIAGNOSIS — F0151 Vascular dementia with behavioral disturbance: Secondary | ICD-10-CM | POA: Diagnosis not present

## 2019-03-23 DIAGNOSIS — N179 Acute kidney failure, unspecified: Secondary | ICD-10-CM | POA: Diagnosis not present

## 2019-03-23 DIAGNOSIS — R509 Fever, unspecified: Secondary | ICD-10-CM | POA: Diagnosis not present

## 2019-03-23 DIAGNOSIS — G9340 Encephalopathy, unspecified: Secondary | ICD-10-CM | POA: Diagnosis not present

## 2019-03-23 DIAGNOSIS — E870 Hyperosmolality and hypernatremia: Secondary | ICD-10-CM | POA: Diagnosis not present

## 2019-03-24 DIAGNOSIS — N179 Acute kidney failure, unspecified: Secondary | ICD-10-CM | POA: Diagnosis not present

## 2019-03-24 DIAGNOSIS — G9340 Encephalopathy, unspecified: Secondary | ICD-10-CM | POA: Diagnosis not present

## 2019-03-24 DIAGNOSIS — E870 Hyperosmolality and hypernatremia: Secondary | ICD-10-CM | POA: Diagnosis not present

## 2019-03-24 DIAGNOSIS — R509 Fever, unspecified: Secondary | ICD-10-CM | POA: Diagnosis not present

## 2019-03-24 DIAGNOSIS — F0151 Vascular dementia with behavioral disturbance: Secondary | ICD-10-CM | POA: Diagnosis not present

## 2019-03-25 DIAGNOSIS — F0151 Vascular dementia with behavioral disturbance: Secondary | ICD-10-CM | POA: Diagnosis not present

## 2019-03-25 DIAGNOSIS — N179 Acute kidney failure, unspecified: Secondary | ICD-10-CM | POA: Diagnosis not present

## 2019-03-25 DIAGNOSIS — R509 Fever, unspecified: Secondary | ICD-10-CM | POA: Diagnosis not present

## 2019-03-25 DIAGNOSIS — E870 Hyperosmolality and hypernatremia: Secondary | ICD-10-CM | POA: Diagnosis not present

## 2019-03-26 DIAGNOSIS — N179 Acute kidney failure, unspecified: Secondary | ICD-10-CM | POA: Diagnosis not present

## 2019-03-26 DIAGNOSIS — R509 Fever, unspecified: Secondary | ICD-10-CM | POA: Diagnosis not present

## 2019-03-26 DIAGNOSIS — E870 Hyperosmolality and hypernatremia: Secondary | ICD-10-CM | POA: Diagnosis not present

## 2019-03-26 DIAGNOSIS — F0151 Vascular dementia with behavioral disturbance: Secondary | ICD-10-CM | POA: Diagnosis not present

## 2019-03-27 DIAGNOSIS — N179 Acute kidney failure, unspecified: Secondary | ICD-10-CM | POA: Diagnosis not present

## 2019-03-27 DIAGNOSIS — E870 Hyperosmolality and hypernatremia: Secondary | ICD-10-CM | POA: Diagnosis not present

## 2019-03-27 DIAGNOSIS — F0151 Vascular dementia with behavioral disturbance: Secondary | ICD-10-CM | POA: Diagnosis not present

## 2019-03-27 DIAGNOSIS — R509 Fever, unspecified: Secondary | ICD-10-CM | POA: Diagnosis not present

## 2019-03-28 DIAGNOSIS — F0151 Vascular dementia with behavioral disturbance: Secondary | ICD-10-CM | POA: Diagnosis not present

## 2019-03-28 DIAGNOSIS — N179 Acute kidney failure, unspecified: Secondary | ICD-10-CM | POA: Diagnosis not present

## 2019-03-28 DIAGNOSIS — E870 Hyperosmolality and hypernatremia: Secondary | ICD-10-CM | POA: Diagnosis not present

## 2019-03-28 DIAGNOSIS — R509 Fever, unspecified: Secondary | ICD-10-CM | POA: Diagnosis not present

## 2019-03-29 DIAGNOSIS — F0151 Vascular dementia with behavioral disturbance: Secondary | ICD-10-CM | POA: Diagnosis not present

## 2019-03-29 DIAGNOSIS — R509 Fever, unspecified: Secondary | ICD-10-CM | POA: Diagnosis not present

## 2019-03-29 DIAGNOSIS — E870 Hyperosmolality and hypernatremia: Secondary | ICD-10-CM | POA: Diagnosis not present

## 2019-03-29 DIAGNOSIS — N179 Acute kidney failure, unspecified: Secondary | ICD-10-CM | POA: Diagnosis not present

## 2019-03-30 DIAGNOSIS — N179 Acute kidney failure, unspecified: Secondary | ICD-10-CM | POA: Diagnosis not present

## 2019-03-30 DIAGNOSIS — E870 Hyperosmolality and hypernatremia: Secondary | ICD-10-CM | POA: Diagnosis not present

## 2019-03-30 DIAGNOSIS — R509 Fever, unspecified: Secondary | ICD-10-CM | POA: Diagnosis not present

## 2019-03-30 DIAGNOSIS — F0151 Vascular dementia with behavioral disturbance: Secondary | ICD-10-CM | POA: Diagnosis not present

## 2019-03-31 DIAGNOSIS — E870 Hyperosmolality and hypernatremia: Secondary | ICD-10-CM | POA: Diagnosis not present

## 2019-03-31 DIAGNOSIS — F0151 Vascular dementia with behavioral disturbance: Secondary | ICD-10-CM | POA: Diagnosis not present

## 2019-03-31 DIAGNOSIS — R509 Fever, unspecified: Secondary | ICD-10-CM | POA: Diagnosis not present

## 2019-03-31 DIAGNOSIS — N179 Acute kidney failure, unspecified: Secondary | ICD-10-CM | POA: Diagnosis not present

## 2019-04-01 DIAGNOSIS — F0151 Vascular dementia with behavioral disturbance: Secondary | ICD-10-CM | POA: Diagnosis not present

## 2019-04-01 DIAGNOSIS — G9349 Other encephalopathy: Secondary | ICD-10-CM | POA: Diagnosis not present

## 2019-04-01 DIAGNOSIS — N390 Urinary tract infection, site not specified: Secondary | ICD-10-CM | POA: Diagnosis not present

## 2019-04-01 DIAGNOSIS — I5032 Chronic diastolic (congestive) heart failure: Secondary | ICD-10-CM | POA: Diagnosis not present

## 2019-04-01 DIAGNOSIS — E870 Hyperosmolality and hypernatremia: Secondary | ICD-10-CM | POA: Diagnosis not present

## 2019-04-01 DIAGNOSIS — N179 Acute kidney failure, unspecified: Secondary | ICD-10-CM | POA: Diagnosis not present

## 2019-04-01 DIAGNOSIS — R509 Fever, unspecified: Secondary | ICD-10-CM | POA: Diagnosis not present

## 2019-04-01 DIAGNOSIS — G9341 Metabolic encephalopathy: Secondary | ICD-10-CM | POA: Diagnosis not present

## 2019-04-01 DIAGNOSIS — I248 Other forms of acute ischemic heart disease: Secondary | ICD-10-CM | POA: Diagnosis not present

## 2019-04-01 DIAGNOSIS — M6282 Rhabdomyolysis: Secondary | ICD-10-CM | POA: Diagnosis not present

## 2019-04-02 DIAGNOSIS — Z79899 Other long term (current) drug therapy: Secondary | ICD-10-CM | POA: Diagnosis not present

## 2019-04-02 DIAGNOSIS — M24542 Contracture, left hand: Secondary | ICD-10-CM | POA: Diagnosis not present

## 2019-04-02 DIAGNOSIS — R1312 Dysphagia, oropharyngeal phase: Secondary | ICD-10-CM | POA: Diagnosis not present

## 2019-04-02 DIAGNOSIS — M24522 Contracture, left elbow: Secondary | ICD-10-CM | POA: Diagnosis not present

## 2019-04-02 DIAGNOSIS — R41841 Cognitive communication deficit: Secondary | ICD-10-CM | POA: Diagnosis not present

## 2019-04-02 DIAGNOSIS — Z20828 Contact with and (suspected) exposure to other viral communicable diseases: Secondary | ICD-10-CM | POA: Diagnosis not present

## 2019-04-02 DIAGNOSIS — Z1383 Encounter for screening for respiratory disorder NEC: Secondary | ICD-10-CM | POA: Diagnosis not present

## 2019-04-02 DIAGNOSIS — R509 Fever, unspecified: Secondary | ICD-10-CM | POA: Diagnosis not present

## 2019-04-02 DIAGNOSIS — R1311 Dysphagia, oral phase: Secondary | ICD-10-CM | POA: Diagnosis not present

## 2019-04-02 DIAGNOSIS — I10 Essential (primary) hypertension: Secondary | ICD-10-CM | POA: Diagnosis not present

## 2019-04-02 DIAGNOSIS — N179 Acute kidney failure, unspecified: Secondary | ICD-10-CM | POA: Diagnosis not present

## 2019-04-02 DIAGNOSIS — R1313 Dysphagia, pharyngeal phase: Secondary | ICD-10-CM | POA: Diagnosis not present

## 2019-04-02 DIAGNOSIS — F0151 Vascular dementia with behavioral disturbance: Secondary | ICD-10-CM | POA: Diagnosis not present

## 2019-04-02 DIAGNOSIS — G9341 Metabolic encephalopathy: Secondary | ICD-10-CM | POA: Diagnosis not present

## 2019-04-02 DIAGNOSIS — E1122 Type 2 diabetes mellitus with diabetic chronic kidney disease: Secondary | ICD-10-CM | POA: Diagnosis not present

## 2019-04-02 DIAGNOSIS — M6281 Muscle weakness (generalized): Secondary | ICD-10-CM | POA: Diagnosis not present

## 2019-04-03 DIAGNOSIS — N390 Urinary tract infection, site not specified: Secondary | ICD-10-CM | POA: Diagnosis not present

## 2019-04-03 DIAGNOSIS — T07XXXA Unspecified multiple injuries, initial encounter: Secondary | ICD-10-CM | POA: Diagnosis not present

## 2019-04-03 DIAGNOSIS — R509 Fever, unspecified: Secondary | ICD-10-CM | POA: Diagnosis not present

## 2019-04-03 DIAGNOSIS — E870 Hyperosmolality and hypernatremia: Secondary | ICD-10-CM | POA: Diagnosis not present

## 2019-04-04 DIAGNOSIS — M6281 Muscle weakness (generalized): Secondary | ICD-10-CM | POA: Diagnosis not present

## 2019-04-04 DIAGNOSIS — E1122 Type 2 diabetes mellitus with diabetic chronic kidney disease: Secondary | ICD-10-CM | POA: Diagnosis not present

## 2019-04-04 DIAGNOSIS — R1311 Dysphagia, oral phase: Secondary | ICD-10-CM | POA: Diagnosis not present

## 2019-04-04 DIAGNOSIS — M24522 Contracture, left elbow: Secondary | ICD-10-CM | POA: Diagnosis not present

## 2019-04-04 DIAGNOSIS — R1312 Dysphagia, oropharyngeal phase: Secondary | ICD-10-CM | POA: Diagnosis not present

## 2019-04-04 DIAGNOSIS — R41841 Cognitive communication deficit: Secondary | ICD-10-CM | POA: Diagnosis not present

## 2019-04-04 DIAGNOSIS — M24542 Contracture, left hand: Secondary | ICD-10-CM | POA: Diagnosis not present

## 2019-04-04 DIAGNOSIS — R1313 Dysphagia, pharyngeal phase: Secondary | ICD-10-CM | POA: Diagnosis not present

## 2019-04-04 DIAGNOSIS — G9341 Metabolic encephalopathy: Secondary | ICD-10-CM | POA: Diagnosis not present

## 2019-04-05 DIAGNOSIS — G9341 Metabolic encephalopathy: Secondary | ICD-10-CM | POA: Diagnosis not present

## 2019-04-05 DIAGNOSIS — E1122 Type 2 diabetes mellitus with diabetic chronic kidney disease: Secondary | ICD-10-CM | POA: Diagnosis not present

## 2019-04-05 DIAGNOSIS — R1312 Dysphagia, oropharyngeal phase: Secondary | ICD-10-CM | POA: Diagnosis not present

## 2019-04-05 DIAGNOSIS — M24522 Contracture, left elbow: Secondary | ICD-10-CM | POA: Diagnosis not present

## 2019-04-05 DIAGNOSIS — R41841 Cognitive communication deficit: Secondary | ICD-10-CM | POA: Diagnosis not present

## 2019-04-05 DIAGNOSIS — R1313 Dysphagia, pharyngeal phase: Secondary | ICD-10-CM | POA: Diagnosis not present

## 2019-04-05 DIAGNOSIS — M6281 Muscle weakness (generalized): Secondary | ICD-10-CM | POA: Diagnosis not present

## 2019-04-05 DIAGNOSIS — R1311 Dysphagia, oral phase: Secondary | ICD-10-CM | POA: Diagnosis not present

## 2019-04-05 DIAGNOSIS — M24542 Contracture, left hand: Secondary | ICD-10-CM | POA: Diagnosis not present

## 2019-04-07 DIAGNOSIS — M24522 Contracture, left elbow: Secondary | ICD-10-CM | POA: Diagnosis not present

## 2019-04-07 DIAGNOSIS — R1311 Dysphagia, oral phase: Secondary | ICD-10-CM | POA: Diagnosis not present

## 2019-04-07 DIAGNOSIS — G9341 Metabolic encephalopathy: Secondary | ICD-10-CM | POA: Diagnosis not present

## 2019-04-07 DIAGNOSIS — M24542 Contracture, left hand: Secondary | ICD-10-CM | POA: Diagnosis not present

## 2019-04-07 DIAGNOSIS — R1312 Dysphagia, oropharyngeal phase: Secondary | ICD-10-CM | POA: Diagnosis not present

## 2019-04-07 DIAGNOSIS — E1122 Type 2 diabetes mellitus with diabetic chronic kidney disease: Secondary | ICD-10-CM | POA: Diagnosis not present

## 2019-04-07 DIAGNOSIS — R1313 Dysphagia, pharyngeal phase: Secondary | ICD-10-CM | POA: Diagnosis not present

## 2019-04-07 DIAGNOSIS — R41841 Cognitive communication deficit: Secondary | ICD-10-CM | POA: Diagnosis not present

## 2019-04-07 DIAGNOSIS — M6281 Muscle weakness (generalized): Secondary | ICD-10-CM | POA: Diagnosis not present

## 2019-04-08 DIAGNOSIS — E1122 Type 2 diabetes mellitus with diabetic chronic kidney disease: Secondary | ICD-10-CM | POA: Diagnosis not present

## 2019-04-08 DIAGNOSIS — R1311 Dysphagia, oral phase: Secondary | ICD-10-CM | POA: Diagnosis not present

## 2019-04-08 DIAGNOSIS — R1313 Dysphagia, pharyngeal phase: Secondary | ICD-10-CM | POA: Diagnosis not present

## 2019-04-08 DIAGNOSIS — M24522 Contracture, left elbow: Secondary | ICD-10-CM | POA: Diagnosis not present

## 2019-04-08 DIAGNOSIS — M6281 Muscle weakness (generalized): Secondary | ICD-10-CM | POA: Diagnosis not present

## 2019-04-08 DIAGNOSIS — R41841 Cognitive communication deficit: Secondary | ICD-10-CM | POA: Diagnosis not present

## 2019-04-08 DIAGNOSIS — I10 Essential (primary) hypertension: Secondary | ICD-10-CM | POA: Diagnosis not present

## 2019-04-08 DIAGNOSIS — R509 Fever, unspecified: Secondary | ICD-10-CM | POA: Diagnosis not present

## 2019-04-08 DIAGNOSIS — G9341 Metabolic encephalopathy: Secondary | ICD-10-CM | POA: Diagnosis not present

## 2019-04-08 DIAGNOSIS — N390 Urinary tract infection, site not specified: Secondary | ICD-10-CM | POA: Diagnosis not present

## 2019-04-08 DIAGNOSIS — R1312 Dysphagia, oropharyngeal phase: Secondary | ICD-10-CM | POA: Diagnosis not present

## 2019-04-08 DIAGNOSIS — F0151 Vascular dementia with behavioral disturbance: Secondary | ICD-10-CM | POA: Diagnosis not present

## 2019-04-08 DIAGNOSIS — M24542 Contracture, left hand: Secondary | ICD-10-CM | POA: Diagnosis not present

## 2019-04-09 DIAGNOSIS — M24522 Contracture, left elbow: Secondary | ICD-10-CM | POA: Diagnosis not present

## 2019-04-09 DIAGNOSIS — R1311 Dysphagia, oral phase: Secondary | ICD-10-CM | POA: Diagnosis not present

## 2019-04-09 DIAGNOSIS — F0151 Vascular dementia with behavioral disturbance: Secondary | ICD-10-CM | POA: Diagnosis not present

## 2019-04-09 DIAGNOSIS — R41841 Cognitive communication deficit: Secondary | ICD-10-CM | POA: Diagnosis not present

## 2019-04-09 DIAGNOSIS — R1313 Dysphagia, pharyngeal phase: Secondary | ICD-10-CM | POA: Diagnosis not present

## 2019-04-09 DIAGNOSIS — M6281 Muscle weakness (generalized): Secondary | ICD-10-CM | POA: Diagnosis not present

## 2019-04-09 DIAGNOSIS — E870 Hyperosmolality and hypernatremia: Secondary | ICD-10-CM | POA: Diagnosis not present

## 2019-04-09 DIAGNOSIS — M24542 Contracture, left hand: Secondary | ICD-10-CM | POA: Diagnosis not present

## 2019-04-09 DIAGNOSIS — E1122 Type 2 diabetes mellitus with diabetic chronic kidney disease: Secondary | ICD-10-CM | POA: Diagnosis not present

## 2019-04-09 DIAGNOSIS — R1312 Dysphagia, oropharyngeal phase: Secondary | ICD-10-CM | POA: Diagnosis not present

## 2019-04-09 DIAGNOSIS — N390 Urinary tract infection, site not specified: Secondary | ICD-10-CM | POA: Diagnosis not present

## 2019-04-09 DIAGNOSIS — G9341 Metabolic encephalopathy: Secondary | ICD-10-CM | POA: Diagnosis not present

## 2019-04-10 DIAGNOSIS — E1122 Type 2 diabetes mellitus with diabetic chronic kidney disease: Secondary | ICD-10-CM | POA: Diagnosis not present

## 2019-04-10 DIAGNOSIS — R1313 Dysphagia, pharyngeal phase: Secondary | ICD-10-CM | POA: Diagnosis not present

## 2019-04-10 DIAGNOSIS — M24522 Contracture, left elbow: Secondary | ICD-10-CM | POA: Diagnosis not present

## 2019-04-10 DIAGNOSIS — M24542 Contracture, left hand: Secondary | ICD-10-CM | POA: Diagnosis not present

## 2019-04-10 DIAGNOSIS — R41841 Cognitive communication deficit: Secondary | ICD-10-CM | POA: Diagnosis not present

## 2019-04-10 DIAGNOSIS — M6281 Muscle weakness (generalized): Secondary | ICD-10-CM | POA: Diagnosis not present

## 2019-04-10 DIAGNOSIS — G9341 Metabolic encephalopathy: Secondary | ICD-10-CM | POA: Diagnosis not present

## 2019-04-10 DIAGNOSIS — R1311 Dysphagia, oral phase: Secondary | ICD-10-CM | POA: Diagnosis not present

## 2019-04-10 DIAGNOSIS — R1312 Dysphagia, oropharyngeal phase: Secondary | ICD-10-CM | POA: Diagnosis not present

## 2019-04-11 DIAGNOSIS — G9341 Metabolic encephalopathy: Secondary | ICD-10-CM | POA: Diagnosis not present

## 2019-04-11 DIAGNOSIS — M24522 Contracture, left elbow: Secondary | ICD-10-CM | POA: Diagnosis not present

## 2019-04-11 DIAGNOSIS — R41841 Cognitive communication deficit: Secondary | ICD-10-CM | POA: Diagnosis not present

## 2019-04-11 DIAGNOSIS — E1122 Type 2 diabetes mellitus with diabetic chronic kidney disease: Secondary | ICD-10-CM | POA: Diagnosis not present

## 2019-04-11 DIAGNOSIS — M6281 Muscle weakness (generalized): Secondary | ICD-10-CM | POA: Diagnosis not present

## 2019-04-11 DIAGNOSIS — R1312 Dysphagia, oropharyngeal phase: Secondary | ICD-10-CM | POA: Diagnosis not present

## 2019-04-11 DIAGNOSIS — R1311 Dysphagia, oral phase: Secondary | ICD-10-CM | POA: Diagnosis not present

## 2019-04-11 DIAGNOSIS — M24542 Contracture, left hand: Secondary | ICD-10-CM | POA: Diagnosis not present

## 2019-04-11 DIAGNOSIS — R1313 Dysphagia, pharyngeal phase: Secondary | ICD-10-CM | POA: Diagnosis not present

## 2019-04-12 DIAGNOSIS — E1122 Type 2 diabetes mellitus with diabetic chronic kidney disease: Secondary | ICD-10-CM | POA: Diagnosis not present

## 2019-04-12 DIAGNOSIS — M6281 Muscle weakness (generalized): Secondary | ICD-10-CM | POA: Diagnosis not present

## 2019-04-12 DIAGNOSIS — M24542 Contracture, left hand: Secondary | ICD-10-CM | POA: Diagnosis not present

## 2019-04-12 DIAGNOSIS — R41841 Cognitive communication deficit: Secondary | ICD-10-CM | POA: Diagnosis not present

## 2019-04-12 DIAGNOSIS — G9341 Metabolic encephalopathy: Secondary | ICD-10-CM | POA: Diagnosis not present

## 2019-04-12 DIAGNOSIS — R1313 Dysphagia, pharyngeal phase: Secondary | ICD-10-CM | POA: Diagnosis not present

## 2019-04-12 DIAGNOSIS — R1312 Dysphagia, oropharyngeal phase: Secondary | ICD-10-CM | POA: Diagnosis not present

## 2019-04-12 DIAGNOSIS — M24522 Contracture, left elbow: Secondary | ICD-10-CM | POA: Diagnosis not present

## 2019-04-12 DIAGNOSIS — R1311 Dysphagia, oral phase: Secondary | ICD-10-CM | POA: Diagnosis not present

## 2019-04-14 DIAGNOSIS — R1311 Dysphagia, oral phase: Secondary | ICD-10-CM | POA: Diagnosis not present

## 2019-04-14 DIAGNOSIS — E1122 Type 2 diabetes mellitus with diabetic chronic kidney disease: Secondary | ICD-10-CM | POA: Diagnosis not present

## 2019-04-14 DIAGNOSIS — R1313 Dysphagia, pharyngeal phase: Secondary | ICD-10-CM | POA: Diagnosis not present

## 2019-04-14 DIAGNOSIS — M6281 Muscle weakness (generalized): Secondary | ICD-10-CM | POA: Diagnosis not present

## 2019-04-14 DIAGNOSIS — G9341 Metabolic encephalopathy: Secondary | ICD-10-CM | POA: Diagnosis not present

## 2019-04-14 DIAGNOSIS — Z1383 Encounter for screening for respiratory disorder NEC: Secondary | ICD-10-CM | POA: Diagnosis not present

## 2019-04-14 DIAGNOSIS — R41841 Cognitive communication deficit: Secondary | ICD-10-CM | POA: Diagnosis not present

## 2019-04-14 DIAGNOSIS — R1312 Dysphagia, oropharyngeal phase: Secondary | ICD-10-CM | POA: Diagnosis not present

## 2019-04-14 DIAGNOSIS — M24522 Contracture, left elbow: Secondary | ICD-10-CM | POA: Diagnosis not present

## 2019-04-14 DIAGNOSIS — Z20828 Contact with and (suspected) exposure to other viral communicable diseases: Secondary | ICD-10-CM | POA: Diagnosis not present

## 2019-04-14 DIAGNOSIS — M24542 Contracture, left hand: Secondary | ICD-10-CM | POA: Diagnosis not present

## 2019-04-15 DIAGNOSIS — R41841 Cognitive communication deficit: Secondary | ICD-10-CM | POA: Diagnosis not present

## 2019-04-15 DIAGNOSIS — G9341 Metabolic encephalopathy: Secondary | ICD-10-CM | POA: Diagnosis not present

## 2019-04-15 DIAGNOSIS — M24542 Contracture, left hand: Secondary | ICD-10-CM | POA: Diagnosis not present

## 2019-04-15 DIAGNOSIS — E1122 Type 2 diabetes mellitus with diabetic chronic kidney disease: Secondary | ICD-10-CM | POA: Diagnosis not present

## 2019-04-15 DIAGNOSIS — M6281 Muscle weakness (generalized): Secondary | ICD-10-CM | POA: Diagnosis not present

## 2019-04-15 DIAGNOSIS — R1311 Dysphagia, oral phase: Secondary | ICD-10-CM | POA: Diagnosis not present

## 2019-04-15 DIAGNOSIS — R1313 Dysphagia, pharyngeal phase: Secondary | ICD-10-CM | POA: Diagnosis not present

## 2019-04-15 DIAGNOSIS — T07XXXA Unspecified multiple injuries, initial encounter: Secondary | ICD-10-CM | POA: Diagnosis not present

## 2019-04-15 DIAGNOSIS — F0151 Vascular dementia with behavioral disturbance: Secondary | ICD-10-CM | POA: Diagnosis not present

## 2019-04-15 DIAGNOSIS — M24522 Contracture, left elbow: Secondary | ICD-10-CM | POA: Diagnosis not present

## 2019-04-15 DIAGNOSIS — R1312 Dysphagia, oropharyngeal phase: Secondary | ICD-10-CM | POA: Diagnosis not present

## 2019-04-15 DIAGNOSIS — R5381 Other malaise: Secondary | ICD-10-CM | POA: Diagnosis not present

## 2019-04-16 DIAGNOSIS — E1122 Type 2 diabetes mellitus with diabetic chronic kidney disease: Secondary | ICD-10-CM | POA: Diagnosis not present

## 2019-04-16 DIAGNOSIS — M24542 Contracture, left hand: Secondary | ICD-10-CM | POA: Diagnosis not present

## 2019-04-16 DIAGNOSIS — R41841 Cognitive communication deficit: Secondary | ICD-10-CM | POA: Diagnosis not present

## 2019-04-16 DIAGNOSIS — R1311 Dysphagia, oral phase: Secondary | ICD-10-CM | POA: Diagnosis not present

## 2019-04-16 DIAGNOSIS — G9341 Metabolic encephalopathy: Secondary | ICD-10-CM | POA: Diagnosis not present

## 2019-04-16 DIAGNOSIS — M6281 Muscle weakness (generalized): Secondary | ICD-10-CM | POA: Diagnosis not present

## 2019-04-16 DIAGNOSIS — M24522 Contracture, left elbow: Secondary | ICD-10-CM | POA: Diagnosis not present

## 2019-04-16 DIAGNOSIS — R1313 Dysphagia, pharyngeal phase: Secondary | ICD-10-CM | POA: Diagnosis not present

## 2019-04-16 DIAGNOSIS — R1312 Dysphagia, oropharyngeal phase: Secondary | ICD-10-CM | POA: Diagnosis not present

## 2019-04-17 DIAGNOSIS — R1311 Dysphagia, oral phase: Secondary | ICD-10-CM | POA: Diagnosis not present

## 2019-04-17 DIAGNOSIS — I5032 Chronic diastolic (congestive) heart failure: Secondary | ICD-10-CM | POA: Diagnosis not present

## 2019-04-17 DIAGNOSIS — R41841 Cognitive communication deficit: Secondary | ICD-10-CM | POA: Diagnosis not present

## 2019-04-17 DIAGNOSIS — E1122 Type 2 diabetes mellitus with diabetic chronic kidney disease: Secondary | ICD-10-CM | POA: Diagnosis not present

## 2019-04-17 DIAGNOSIS — F0151 Vascular dementia with behavioral disturbance: Secondary | ICD-10-CM | POA: Diagnosis not present

## 2019-04-17 DIAGNOSIS — M24522 Contracture, left elbow: Secondary | ICD-10-CM | POA: Diagnosis not present

## 2019-04-17 DIAGNOSIS — T07XXXA Unspecified multiple injuries, initial encounter: Secondary | ICD-10-CM | POA: Diagnosis not present

## 2019-04-17 DIAGNOSIS — G9341 Metabolic encephalopathy: Secondary | ICD-10-CM | POA: Diagnosis not present

## 2019-04-17 DIAGNOSIS — E119 Type 2 diabetes mellitus without complications: Secondary | ICD-10-CM | POA: Diagnosis not present

## 2019-04-17 DIAGNOSIS — R1312 Dysphagia, oropharyngeal phase: Secondary | ICD-10-CM | POA: Diagnosis not present

## 2019-04-17 DIAGNOSIS — M24542 Contracture, left hand: Secondary | ICD-10-CM | POA: Diagnosis not present

## 2019-04-17 DIAGNOSIS — M6281 Muscle weakness (generalized): Secondary | ICD-10-CM | POA: Diagnosis not present

## 2019-04-17 DIAGNOSIS — R1313 Dysphagia, pharyngeal phase: Secondary | ICD-10-CM | POA: Diagnosis not present

## 2019-04-18 DIAGNOSIS — R41841 Cognitive communication deficit: Secondary | ICD-10-CM | POA: Diagnosis not present

## 2019-04-18 DIAGNOSIS — E1122 Type 2 diabetes mellitus with diabetic chronic kidney disease: Secondary | ICD-10-CM | POA: Diagnosis not present

## 2019-04-18 DIAGNOSIS — M24542 Contracture, left hand: Secondary | ICD-10-CM | POA: Diagnosis not present

## 2019-04-18 DIAGNOSIS — F0151 Vascular dementia with behavioral disturbance: Secondary | ICD-10-CM | POA: Diagnosis not present

## 2019-04-18 DIAGNOSIS — M6281 Muscle weakness (generalized): Secondary | ICD-10-CM | POA: Diagnosis not present

## 2019-04-18 DIAGNOSIS — G9341 Metabolic encephalopathy: Secondary | ICD-10-CM | POA: Diagnosis not present

## 2019-04-18 DIAGNOSIS — F1411 Cocaine abuse, in remission: Secondary | ICD-10-CM | POA: Diagnosis not present

## 2019-04-18 DIAGNOSIS — R1312 Dysphagia, oropharyngeal phase: Secondary | ICD-10-CM | POA: Diagnosis not present

## 2019-04-18 DIAGNOSIS — F319 Bipolar disorder, unspecified: Secondary | ICD-10-CM | POA: Diagnosis not present

## 2019-04-18 DIAGNOSIS — R1311 Dysphagia, oral phase: Secondary | ICD-10-CM | POA: Diagnosis not present

## 2019-04-18 DIAGNOSIS — R1313 Dysphagia, pharyngeal phase: Secondary | ICD-10-CM | POA: Diagnosis not present

## 2019-04-18 DIAGNOSIS — M24522 Contracture, left elbow: Secondary | ICD-10-CM | POA: Diagnosis not present

## 2019-04-21 DIAGNOSIS — Z1383 Encounter for screening for respiratory disorder NEC: Secondary | ICD-10-CM | POA: Diagnosis not present

## 2019-04-21 DIAGNOSIS — Z20828 Contact with and (suspected) exposure to other viral communicable diseases: Secondary | ICD-10-CM | POA: Diagnosis not present

## 2019-04-22 DIAGNOSIS — T07XXXA Unspecified multiple injuries, initial encounter: Secondary | ICD-10-CM | POA: Diagnosis not present

## 2019-04-22 DIAGNOSIS — I10 Essential (primary) hypertension: Secondary | ICD-10-CM | POA: Diagnosis not present

## 2019-04-22 DIAGNOSIS — F0151 Vascular dementia with behavioral disturbance: Secondary | ICD-10-CM | POA: Diagnosis not present

## 2019-04-22 DIAGNOSIS — E119 Type 2 diabetes mellitus without complications: Secondary | ICD-10-CM | POA: Diagnosis not present

## 2019-04-24 DIAGNOSIS — R5381 Other malaise: Secondary | ICD-10-CM | POA: Diagnosis not present

## 2019-04-24 DIAGNOSIS — F0151 Vascular dementia with behavioral disturbance: Secondary | ICD-10-CM | POA: Diagnosis not present

## 2019-04-28 DIAGNOSIS — Z20828 Contact with and (suspected) exposure to other viral communicable diseases: Secondary | ICD-10-CM | POA: Diagnosis not present

## 2019-04-28 DIAGNOSIS — Z1383 Encounter for screening for respiratory disorder NEC: Secondary | ICD-10-CM | POA: Diagnosis not present

## 2019-04-30 DIAGNOSIS — T07XXXA Unspecified multiple injuries, initial encounter: Secondary | ICD-10-CM | POA: Diagnosis not present

## 2019-04-30 DIAGNOSIS — R5381 Other malaise: Secondary | ICD-10-CM | POA: Diagnosis not present

## 2019-04-30 DIAGNOSIS — R634 Abnormal weight loss: Secondary | ICD-10-CM | POA: Diagnosis not present

## 2019-05-01 DIAGNOSIS — U071 COVID-19: Secondary | ICD-10-CM | POA: Diagnosis not present

## 2019-05-01 DIAGNOSIS — M6281 Muscle weakness (generalized): Secondary | ICD-10-CM | POA: Diagnosis not present

## 2019-05-01 DIAGNOSIS — I517 Cardiomegaly: Secondary | ICD-10-CM | POA: Diagnosis not present

## 2019-05-02 DIAGNOSIS — E119 Type 2 diabetes mellitus without complications: Secondary | ICD-10-CM | POA: Diagnosis not present

## 2019-05-02 DIAGNOSIS — L89150 Pressure ulcer of sacral region, unstageable: Secondary | ICD-10-CM | POA: Diagnosis not present

## 2019-05-02 DIAGNOSIS — I10 Essential (primary) hypertension: Secondary | ICD-10-CM | POA: Diagnosis not present

## 2019-05-02 DIAGNOSIS — U071 COVID-19: Secondary | ICD-10-CM | POA: Diagnosis not present

## 2019-05-03 DIAGNOSIS — L89150 Pressure ulcer of sacral region, unstageable: Secondary | ICD-10-CM | POA: Diagnosis not present

## 2019-05-05 DIAGNOSIS — I10 Essential (primary) hypertension: Secondary | ICD-10-CM | POA: Diagnosis not present

## 2019-05-05 DIAGNOSIS — L89150 Pressure ulcer of sacral region, unstageable: Secondary | ICD-10-CM | POA: Diagnosis not present

## 2019-05-05 DIAGNOSIS — E119 Type 2 diabetes mellitus without complications: Secondary | ICD-10-CM | POA: Diagnosis not present

## 2019-05-05 DIAGNOSIS — U071 COVID-19: Secondary | ICD-10-CM | POA: Diagnosis not present

## 2019-05-07 DIAGNOSIS — U071 COVID-19: Secondary | ICD-10-CM | POA: Diagnosis not present

## 2019-05-07 DIAGNOSIS — I10 Essential (primary) hypertension: Secondary | ICD-10-CM | POA: Diagnosis not present

## 2019-05-07 DIAGNOSIS — L89150 Pressure ulcer of sacral region, unstageable: Secondary | ICD-10-CM | POA: Diagnosis not present

## 2019-05-07 DIAGNOSIS — E119 Type 2 diabetes mellitus without complications: Secondary | ICD-10-CM | POA: Diagnosis not present

## 2019-05-12 DIAGNOSIS — E119 Type 2 diabetes mellitus without complications: Secondary | ICD-10-CM | POA: Diagnosis not present

## 2019-05-12 DIAGNOSIS — N390 Urinary tract infection, site not specified: Secondary | ICD-10-CM | POA: Diagnosis not present

## 2019-05-12 DIAGNOSIS — I10 Essential (primary) hypertension: Secondary | ICD-10-CM | POA: Diagnosis not present

## 2019-05-12 DIAGNOSIS — F0391 Unspecified dementia with behavioral disturbance: Secondary | ICD-10-CM | POA: Diagnosis not present

## 2019-05-12 DIAGNOSIS — U071 COVID-19: Secondary | ICD-10-CM | POA: Diagnosis not present

## 2019-05-13 DIAGNOSIS — Z79899 Other long term (current) drug therapy: Secondary | ICD-10-CM | POA: Diagnosis not present

## 2019-05-13 DIAGNOSIS — U071 COVID-19: Secondary | ICD-10-CM | POA: Diagnosis not present

## 2019-05-13 DIAGNOSIS — I2510 Atherosclerotic heart disease of native coronary artery without angina pectoris: Secondary | ICD-10-CM | POA: Diagnosis not present

## 2019-05-13 DIAGNOSIS — I509 Heart failure, unspecified: Secondary | ICD-10-CM | POA: Diagnosis not present

## 2019-05-13 DIAGNOSIS — R079 Chest pain, unspecified: Secondary | ICD-10-CM | POA: Diagnosis not present

## 2019-05-13 DIAGNOSIS — R404 Transient alteration of awareness: Secondary | ICD-10-CM | POA: Diagnosis not present

## 2019-05-13 DIAGNOSIS — I110 Hypertensive heart disease with heart failure: Secondary | ICD-10-CM | POA: Diagnosis not present

## 2019-05-13 DIAGNOSIS — Z7982 Long term (current) use of aspirin: Secondary | ICD-10-CM | POA: Diagnosis not present

## 2019-05-13 DIAGNOSIS — Z7901 Long term (current) use of anticoagulants: Secondary | ICD-10-CM | POA: Diagnosis not present

## 2019-05-13 DIAGNOSIS — E119 Type 2 diabetes mellitus without complications: Secondary | ICD-10-CM | POA: Diagnosis not present

## 2019-05-13 DIAGNOSIS — J984 Other disorders of lung: Secondary | ICD-10-CM | POA: Diagnosis not present

## 2019-05-14 DIAGNOSIS — F0390 Unspecified dementia without behavioral disturbance: Secondary | ICD-10-CM | POA: Diagnosis not present

## 2019-05-14 DIAGNOSIS — E119 Type 2 diabetes mellitus without complications: Secondary | ICD-10-CM | POA: Diagnosis not present

## 2019-05-14 DIAGNOSIS — I10 Essential (primary) hypertension: Secondary | ICD-10-CM | POA: Diagnosis not present

## 2019-05-14 DIAGNOSIS — M6281 Muscle weakness (generalized): Secondary | ICD-10-CM | POA: Diagnosis not present

## 2019-05-14 DIAGNOSIS — U071 COVID-19: Secondary | ICD-10-CM | POA: Diagnosis not present

## 2019-05-15 DIAGNOSIS — U071 COVID-19: Secondary | ICD-10-CM | POA: Diagnosis not present

## 2019-05-15 DIAGNOSIS — M6281 Muscle weakness (generalized): Secondary | ICD-10-CM | POA: Diagnosis not present

## 2019-05-15 NOTE — Progress Notes (Signed)
This encounter was created in error - please disregard.

## 2019-05-16 DIAGNOSIS — I10 Essential (primary) hypertension: Secondary | ICD-10-CM | POA: Diagnosis not present

## 2019-05-16 DIAGNOSIS — M6281 Muscle weakness (generalized): Secondary | ICD-10-CM | POA: Diagnosis not present

## 2019-05-16 DIAGNOSIS — L98429 Non-pressure chronic ulcer of back with unspecified severity: Secondary | ICD-10-CM | POA: Diagnosis not present

## 2019-05-16 DIAGNOSIS — E119 Type 2 diabetes mellitus without complications: Secondary | ICD-10-CM | POA: Diagnosis not present

## 2019-05-16 DIAGNOSIS — U071 COVID-19: Secondary | ICD-10-CM | POA: Diagnosis not present

## 2019-05-19 DIAGNOSIS — M6281 Muscle weakness (generalized): Secondary | ICD-10-CM | POA: Diagnosis not present

## 2019-05-19 DIAGNOSIS — I10 Essential (primary) hypertension: Secondary | ICD-10-CM | POA: Diagnosis not present

## 2019-05-19 DIAGNOSIS — L98429 Non-pressure chronic ulcer of back with unspecified severity: Secondary | ICD-10-CM | POA: Diagnosis not present

## 2019-05-19 DIAGNOSIS — U071 COVID-19: Secondary | ICD-10-CM | POA: Diagnosis not present

## 2019-05-19 DIAGNOSIS — M533 Sacrococcygeal disorders, not elsewhere classified: Secondary | ICD-10-CM | POA: Diagnosis not present

## 2019-05-19 DIAGNOSIS — E119 Type 2 diabetes mellitus without complications: Secondary | ICD-10-CM | POA: Diagnosis not present

## 2019-05-20 DIAGNOSIS — Z452 Encounter for adjustment and management of vascular access device: Secondary | ICD-10-CM | POA: Diagnosis not present

## 2019-05-20 DIAGNOSIS — R600 Localized edema: Secondary | ICD-10-CM | POA: Diagnosis not present

## 2019-05-20 DIAGNOSIS — L98429 Non-pressure chronic ulcer of back with unspecified severity: Secondary | ICD-10-CM | POA: Diagnosis not present

## 2019-05-20 DIAGNOSIS — Z8673 Personal history of transient ischemic attack (TIA), and cerebral infarction without residual deficits: Secondary | ICD-10-CM | POA: Diagnosis not present

## 2019-05-20 DIAGNOSIS — R1310 Dysphagia, unspecified: Secondary | ICD-10-CM | POA: Diagnosis not present

## 2019-05-20 DIAGNOSIS — J189 Pneumonia, unspecified organism: Secondary | ICD-10-CM | POA: Diagnosis not present

## 2019-05-20 DIAGNOSIS — R509 Fever, unspecified: Secondary | ICD-10-CM | POA: Diagnosis not present

## 2019-05-20 DIAGNOSIS — F0150 Vascular dementia without behavioral disturbance: Secondary | ICD-10-CM | POA: Diagnosis not present

## 2019-05-20 DIAGNOSIS — J90 Pleural effusion, not elsewhere classified: Secondary | ICD-10-CM | POA: Diagnosis not present

## 2019-05-20 DIAGNOSIS — E43 Unspecified severe protein-calorie malnutrition: Secondary | ICD-10-CM | POA: Diagnosis not present

## 2019-05-20 DIAGNOSIS — L8990 Pressure ulcer of unspecified site, unspecified stage: Secondary | ICD-10-CM | POA: Diagnosis not present

## 2019-05-20 DIAGNOSIS — L89150 Pressure ulcer of sacral region, unstageable: Secondary | ICD-10-CM | POA: Diagnosis not present

## 2019-05-20 DIAGNOSIS — R000 Tachycardia, unspecified: Secondary | ICD-10-CM | POA: Diagnosis not present

## 2019-05-20 DIAGNOSIS — J9811 Atelectasis: Secondary | ICD-10-CM | POA: Diagnosis not present

## 2019-05-20 DIAGNOSIS — I10 Essential (primary) hypertension: Secondary | ICD-10-CM | POA: Diagnosis not present

## 2019-05-20 DIAGNOSIS — F0390 Unspecified dementia without behavioral disturbance: Secondary | ICD-10-CM | POA: Diagnosis not present

## 2019-05-20 DIAGNOSIS — R0789 Other chest pain: Secondary | ICD-10-CM | POA: Diagnosis not present

## 2019-05-20 DIAGNOSIS — Z66 Do not resuscitate: Secondary | ICD-10-CM | POA: Diagnosis not present

## 2019-05-20 DIAGNOSIS — R627 Adult failure to thrive: Secondary | ICD-10-CM | POA: Diagnosis not present

## 2019-05-20 DIAGNOSIS — R0902 Hypoxemia: Secondary | ICD-10-CM | POA: Diagnosis not present

## 2019-05-20 DIAGNOSIS — L89154 Pressure ulcer of sacral region, stage 4: Secondary | ICD-10-CM | POA: Diagnosis not present

## 2019-05-20 DIAGNOSIS — M62461 Contracture of muscle, right lower leg: Secondary | ICD-10-CM | POA: Diagnosis not present

## 2019-05-20 DIAGNOSIS — Z681 Body mass index (BMI) 19 or less, adult: Secondary | ICD-10-CM | POA: Diagnosis not present

## 2019-05-20 DIAGNOSIS — I2510 Atherosclerotic heart disease of native coronary artery without angina pectoris: Secondary | ICD-10-CM | POA: Diagnosis not present

## 2019-05-20 DIAGNOSIS — U071 COVID-19: Secondary | ICD-10-CM | POA: Diagnosis not present

## 2019-05-20 DIAGNOSIS — F319 Bipolar disorder, unspecified: Secondary | ICD-10-CM | POA: Diagnosis not present

## 2019-05-20 DIAGNOSIS — R079 Chest pain, unspecified: Secondary | ICD-10-CM | POA: Diagnosis not present

## 2019-05-20 DIAGNOSIS — L089 Local infection of the skin and subcutaneous tissue, unspecified: Secondary | ICD-10-CM | POA: Diagnosis not present

## 2019-05-20 DIAGNOSIS — M62462 Contracture of muscle, left lower leg: Secondary | ICD-10-CM | POA: Diagnosis not present

## 2019-05-20 DIAGNOSIS — Z515 Encounter for palliative care: Secondary | ICD-10-CM | POA: Diagnosis not present

## 2019-05-20 DIAGNOSIS — J1289 Other viral pneumonia: Secondary | ICD-10-CM | POA: Diagnosis not present

## 2019-05-20 DIAGNOSIS — L98422 Non-pressure chronic ulcer of back with fat layer exposed: Secondary | ICD-10-CM | POA: Diagnosis not present

## 2019-05-21 DIAGNOSIS — F0390 Unspecified dementia without behavioral disturbance: Secondary | ICD-10-CM | POA: Diagnosis not present

## 2019-05-21 DIAGNOSIS — I10 Essential (primary) hypertension: Secondary | ICD-10-CM | POA: Diagnosis not present

## 2019-05-21 DIAGNOSIS — U071 COVID-19: Secondary | ICD-10-CM | POA: Diagnosis not present

## 2019-05-21 DIAGNOSIS — L98429 Non-pressure chronic ulcer of back with unspecified severity: Secondary | ICD-10-CM | POA: Diagnosis not present

## 2019-05-22 DIAGNOSIS — I2510 Atherosclerotic heart disease of native coronary artery without angina pectoris: Secondary | ICD-10-CM | POA: Diagnosis not present

## 2019-05-22 DIAGNOSIS — Z515 Encounter for palliative care: Secondary | ICD-10-CM | POA: Diagnosis not present

## 2019-05-22 DIAGNOSIS — Z681 Body mass index (BMI) 19 or less, adult: Secondary | ICD-10-CM | POA: Diagnosis not present

## 2019-05-22 DIAGNOSIS — F319 Bipolar disorder, unspecified: Secondary | ICD-10-CM | POA: Diagnosis not present

## 2019-05-22 DIAGNOSIS — J9811 Atelectasis: Secondary | ICD-10-CM | POA: Diagnosis not present

## 2019-05-22 DIAGNOSIS — U071 COVID-19: Secondary | ICD-10-CM | POA: Diagnosis not present

## 2019-05-22 DIAGNOSIS — L89154 Pressure ulcer of sacral region, stage 4: Secondary | ICD-10-CM | POA: Diagnosis not present

## 2019-05-22 DIAGNOSIS — Z66 Do not resuscitate: Secondary | ICD-10-CM | POA: Diagnosis not present

## 2019-05-22 DIAGNOSIS — E43 Unspecified severe protein-calorie malnutrition: Secondary | ICD-10-CM | POA: Diagnosis not present

## 2019-05-23 DIAGNOSIS — I10 Essential (primary) hypertension: Secondary | ICD-10-CM | POA: Diagnosis not present

## 2019-05-23 DIAGNOSIS — F0390 Unspecified dementia without behavioral disturbance: Secondary | ICD-10-CM | POA: Diagnosis not present

## 2019-05-23 DIAGNOSIS — N390 Urinary tract infection, site not specified: Secondary | ICD-10-CM | POA: Diagnosis not present

## 2019-05-23 DIAGNOSIS — E119 Type 2 diabetes mellitus without complications: Secondary | ICD-10-CM | POA: Diagnosis not present

## 2019-05-23 DIAGNOSIS — L98429 Non-pressure chronic ulcer of back with unspecified severity: Secondary | ICD-10-CM | POA: Diagnosis not present

## 2019-05-26 DIAGNOSIS — U071 COVID-19: Secondary | ICD-10-CM | POA: Diagnosis not present

## 2019-05-26 DIAGNOSIS — L98429 Non-pressure chronic ulcer of back with unspecified severity: Secondary | ICD-10-CM | POA: Diagnosis not present

## 2019-05-26 DIAGNOSIS — I10 Essential (primary) hypertension: Secondary | ICD-10-CM | POA: Diagnosis not present

## 2019-05-26 DIAGNOSIS — E119 Type 2 diabetes mellitus without complications: Secondary | ICD-10-CM | POA: Diagnosis not present

## 2019-05-28 DIAGNOSIS — I10 Essential (primary) hypertension: Secondary | ICD-10-CM | POA: Diagnosis not present

## 2019-05-28 DIAGNOSIS — E119 Type 2 diabetes mellitus without complications: Secondary | ICD-10-CM | POA: Diagnosis not present

## 2019-05-28 DIAGNOSIS — U071 COVID-19: Secondary | ICD-10-CM | POA: Diagnosis not present

## 2019-05-28 DIAGNOSIS — L98429 Non-pressure chronic ulcer of back with unspecified severity: Secondary | ICD-10-CM | POA: Diagnosis not present

## 2019-05-30 DIAGNOSIS — U071 COVID-19: Secondary | ICD-10-CM | POA: Diagnosis not present

## 2019-05-30 DIAGNOSIS — E119 Type 2 diabetes mellitus without complications: Secondary | ICD-10-CM | POA: Diagnosis not present

## 2019-05-30 DIAGNOSIS — L98429 Non-pressure chronic ulcer of back with unspecified severity: Secondary | ICD-10-CM | POA: Diagnosis not present

## 2019-05-30 DIAGNOSIS — I10 Essential (primary) hypertension: Secondary | ICD-10-CM | POA: Diagnosis not present

## 2019-06-05 DIAGNOSIS — R339 Retention of urine, unspecified: Secondary | ICD-10-CM | POA: Diagnosis not present

## 2019-06-05 DIAGNOSIS — U071 COVID-19: Secondary | ICD-10-CM | POA: Diagnosis not present

## 2019-06-05 DIAGNOSIS — E119 Type 2 diabetes mellitus without complications: Secondary | ICD-10-CM | POA: Diagnosis not present

## 2019-06-05 DIAGNOSIS — E440 Moderate protein-calorie malnutrition: Secondary | ICD-10-CM | POA: Diagnosis not present

## 2019-06-05 DIAGNOSIS — L89154 Pressure ulcer of sacral region, stage 4: Secondary | ICD-10-CM | POA: Diagnosis not present

## 2019-06-05 DIAGNOSIS — M24542 Contracture, left hand: Secondary | ICD-10-CM | POA: Diagnosis not present

## 2019-06-06 DIAGNOSIS — E440 Moderate protein-calorie malnutrition: Secondary | ICD-10-CM | POA: Diagnosis not present

## 2019-06-06 DIAGNOSIS — M24542 Contracture, left hand: Secondary | ICD-10-CM | POA: Diagnosis not present

## 2019-06-09 DIAGNOSIS — E440 Moderate protein-calorie malnutrition: Secondary | ICD-10-CM | POA: Diagnosis not present

## 2019-06-09 DIAGNOSIS — L8990 Pressure ulcer of unspecified site, unspecified stage: Secondary | ICD-10-CM | POA: Diagnosis not present

## 2019-06-09 DIAGNOSIS — Z03818 Encounter for observation for suspected exposure to other biological agents ruled out: Secondary | ICD-10-CM | POA: Diagnosis not present

## 2019-06-09 DIAGNOSIS — B3749 Other urogenital candidiasis: Secondary | ICD-10-CM | POA: Diagnosis not present

## 2019-06-09 DIAGNOSIS — D649 Anemia, unspecified: Secondary | ICD-10-CM | POA: Diagnosis not present

## 2019-06-09 DIAGNOSIS — I5032 Chronic diastolic (congestive) heart failure: Secondary | ICD-10-CM | POA: Diagnosis not present

## 2019-06-09 DIAGNOSIS — D631 Anemia in chronic kidney disease: Secondary | ICD-10-CM | POA: Diagnosis not present

## 2019-06-09 DIAGNOSIS — E1122 Type 2 diabetes mellitus with diabetic chronic kidney disease: Secondary | ICD-10-CM | POA: Diagnosis not present

## 2019-06-09 DIAGNOSIS — R532 Functional quadriplegia: Secondary | ICD-10-CM | POA: Diagnosis not present

## 2019-06-09 DIAGNOSIS — N390 Urinary tract infection, site not specified: Secondary | ICD-10-CM | POA: Diagnosis not present

## 2019-06-09 DIAGNOSIS — L89159 Pressure ulcer of sacral region, unspecified stage: Secondary | ICD-10-CM | POA: Diagnosis not present

## 2019-06-09 DIAGNOSIS — E43 Unspecified severe protein-calorie malnutrition: Secondary | ICD-10-CM | POA: Diagnosis not present

## 2019-06-09 DIAGNOSIS — F0150 Vascular dementia without behavioral disturbance: Secondary | ICD-10-CM | POA: Diagnosis not present

## 2019-06-09 DIAGNOSIS — F0390 Unspecified dementia without behavioral disturbance: Secondary | ICD-10-CM | POA: Diagnosis not present

## 2019-06-09 DIAGNOSIS — L89154 Pressure ulcer of sacral region, stage 4: Secondary | ICD-10-CM | POA: Diagnosis not present

## 2019-06-09 DIAGNOSIS — D509 Iron deficiency anemia, unspecified: Secondary | ICD-10-CM | POA: Diagnosis not present

## 2019-06-09 DIAGNOSIS — L89514 Pressure ulcer of right ankle, stage 4: Secondary | ICD-10-CM | POA: Diagnosis not present

## 2019-06-09 DIAGNOSIS — L89513 Pressure ulcer of right ankle, stage 3: Secondary | ICD-10-CM | POA: Diagnosis not present

## 2019-06-09 DIAGNOSIS — F0151 Vascular dementia with behavioral disturbance: Secondary | ICD-10-CM | POA: Diagnosis not present

## 2019-06-09 DIAGNOSIS — M24542 Contracture, left hand: Secondary | ICD-10-CM | POA: Diagnosis not present

## 2019-06-09 DIAGNOSIS — N189 Chronic kidney disease, unspecified: Secondary | ICD-10-CM | POA: Diagnosis not present

## 2019-06-09 DIAGNOSIS — M4628 Osteomyelitis of vertebra, sacral and sacrococcygeal region: Secondary | ICD-10-CM | POA: Diagnosis not present

## 2019-06-16 DIAGNOSIS — T07XXXA Unspecified multiple injuries, initial encounter: Secondary | ICD-10-CM | POA: Diagnosis not present

## 2019-06-16 DIAGNOSIS — F0151 Vascular dementia with behavioral disturbance: Secondary | ICD-10-CM | POA: Diagnosis not present

## 2019-06-16 DIAGNOSIS — D649 Anemia, unspecified: Secondary | ICD-10-CM | POA: Diagnosis not present

## 2019-06-16 DIAGNOSIS — L89154 Pressure ulcer of sacral region, stage 4: Secondary | ICD-10-CM | POA: Diagnosis not present

## 2019-06-17 DIAGNOSIS — D531 Other megaloblastic anemias, not elsewhere classified: Secondary | ICD-10-CM | POA: Diagnosis not present

## 2019-06-17 DIAGNOSIS — L89154 Pressure ulcer of sacral region, stage 4: Secondary | ICD-10-CM | POA: Diagnosis not present

## 2019-06-17 DIAGNOSIS — R5381 Other malaise: Secondary | ICD-10-CM | POA: Diagnosis not present

## 2019-06-17 DIAGNOSIS — D649 Anemia, unspecified: Secondary | ICD-10-CM | POA: Diagnosis not present

## 2019-06-17 DIAGNOSIS — I10 Essential (primary) hypertension: Secondary | ICD-10-CM | POA: Diagnosis not present

## 2019-06-19 DIAGNOSIS — D696 Thrombocytopenia, unspecified: Secondary | ICD-10-CM | POA: Diagnosis not present

## 2019-06-19 DIAGNOSIS — R770 Abnormality of albumin: Secondary | ICD-10-CM | POA: Diagnosis not present

## 2019-06-19 DIAGNOSIS — R2243 Localized swelling, mass and lump, lower limb, bilateral: Secondary | ICD-10-CM | POA: Diagnosis not present

## 2019-06-20 DIAGNOSIS — R2243 Localized swelling, mass and lump, lower limb, bilateral: Secondary | ICD-10-CM | POA: Diagnosis not present

## 2019-06-20 DIAGNOSIS — Z8673 Personal history of transient ischemic attack (TIA), and cerebral infarction without residual deficits: Secondary | ICD-10-CM | POA: Diagnosis not present

## 2019-06-20 DIAGNOSIS — G9341 Metabolic encephalopathy: Secondary | ICD-10-CM | POA: Diagnosis not present

## 2019-06-20 DIAGNOSIS — F0151 Vascular dementia with behavioral disturbance: Secondary | ICD-10-CM | POA: Diagnosis not present

## 2019-06-20 DIAGNOSIS — F319 Bipolar disorder, unspecified: Secondary | ICD-10-CM | POA: Diagnosis not present

## 2019-06-23 DIAGNOSIS — R599 Enlarged lymph nodes, unspecified: Secondary | ICD-10-CM | POA: Diagnosis not present

## 2019-06-23 DIAGNOSIS — D531 Other megaloblastic anemias, not elsewhere classified: Secondary | ICD-10-CM | POA: Diagnosis not present

## 2019-06-23 DIAGNOSIS — E8809 Other disorders of plasma-protein metabolism, not elsewhere classified: Secondary | ICD-10-CM | POA: Diagnosis not present

## 2019-06-27 DIAGNOSIS — F1411 Cocaine abuse, in remission: Secondary | ICD-10-CM | POA: Diagnosis not present

## 2019-06-27 DIAGNOSIS — F0151 Vascular dementia with behavioral disturbance: Secondary | ICD-10-CM | POA: Diagnosis not present

## 2019-06-27 DIAGNOSIS — F319 Bipolar disorder, unspecified: Secondary | ICD-10-CM | POA: Diagnosis not present

## 2019-06-30 DIAGNOSIS — L89154 Pressure ulcer of sacral region, stage 4: Secondary | ICD-10-CM | POA: Diagnosis not present

## 2019-06-30 DIAGNOSIS — F0151 Vascular dementia with behavioral disturbance: Secondary | ICD-10-CM | POA: Diagnosis not present

## 2019-06-30 DIAGNOSIS — D649 Anemia, unspecified: Secondary | ICD-10-CM | POA: Diagnosis not present

## 2019-07-01 DIAGNOSIS — D649 Anemia, unspecified: Secondary | ICD-10-CM | POA: Diagnosis not present

## 2019-07-15 DIAGNOSIS — E119 Type 2 diabetes mellitus without complications: Secondary | ICD-10-CM | POA: Diagnosis not present

## 2019-07-15 DIAGNOSIS — F0151 Vascular dementia with behavioral disturbance: Secondary | ICD-10-CM | POA: Diagnosis not present

## 2019-07-15 DIAGNOSIS — F319 Bipolar disorder, unspecified: Secondary | ICD-10-CM | POA: Diagnosis not present

## 2019-07-15 DIAGNOSIS — I5032 Chronic diastolic (congestive) heart failure: Secondary | ICD-10-CM | POA: Diagnosis not present

## 2019-07-24 DIAGNOSIS — F319 Bipolar disorder, unspecified: Secondary | ICD-10-CM | POA: Diagnosis not present

## 2019-07-24 DIAGNOSIS — F0151 Vascular dementia with behavioral disturbance: Secondary | ICD-10-CM | POA: Diagnosis not present

## 2019-07-28 DIAGNOSIS — Z20828 Contact with and (suspected) exposure to other viral communicable diseases: Secondary | ICD-10-CM | POA: Diagnosis not present

## 2019-12-30 DEATH — deceased

## 2020-01-31 IMAGING — DX DG CHEST 1V PORT
1 series · 1 of 1 positions shown · non-contrast
Comparison: None.

CLINICAL DATA: Shortness of breath.

EXAM:
PORTABLE CHEST 1 VIEW

[chest]
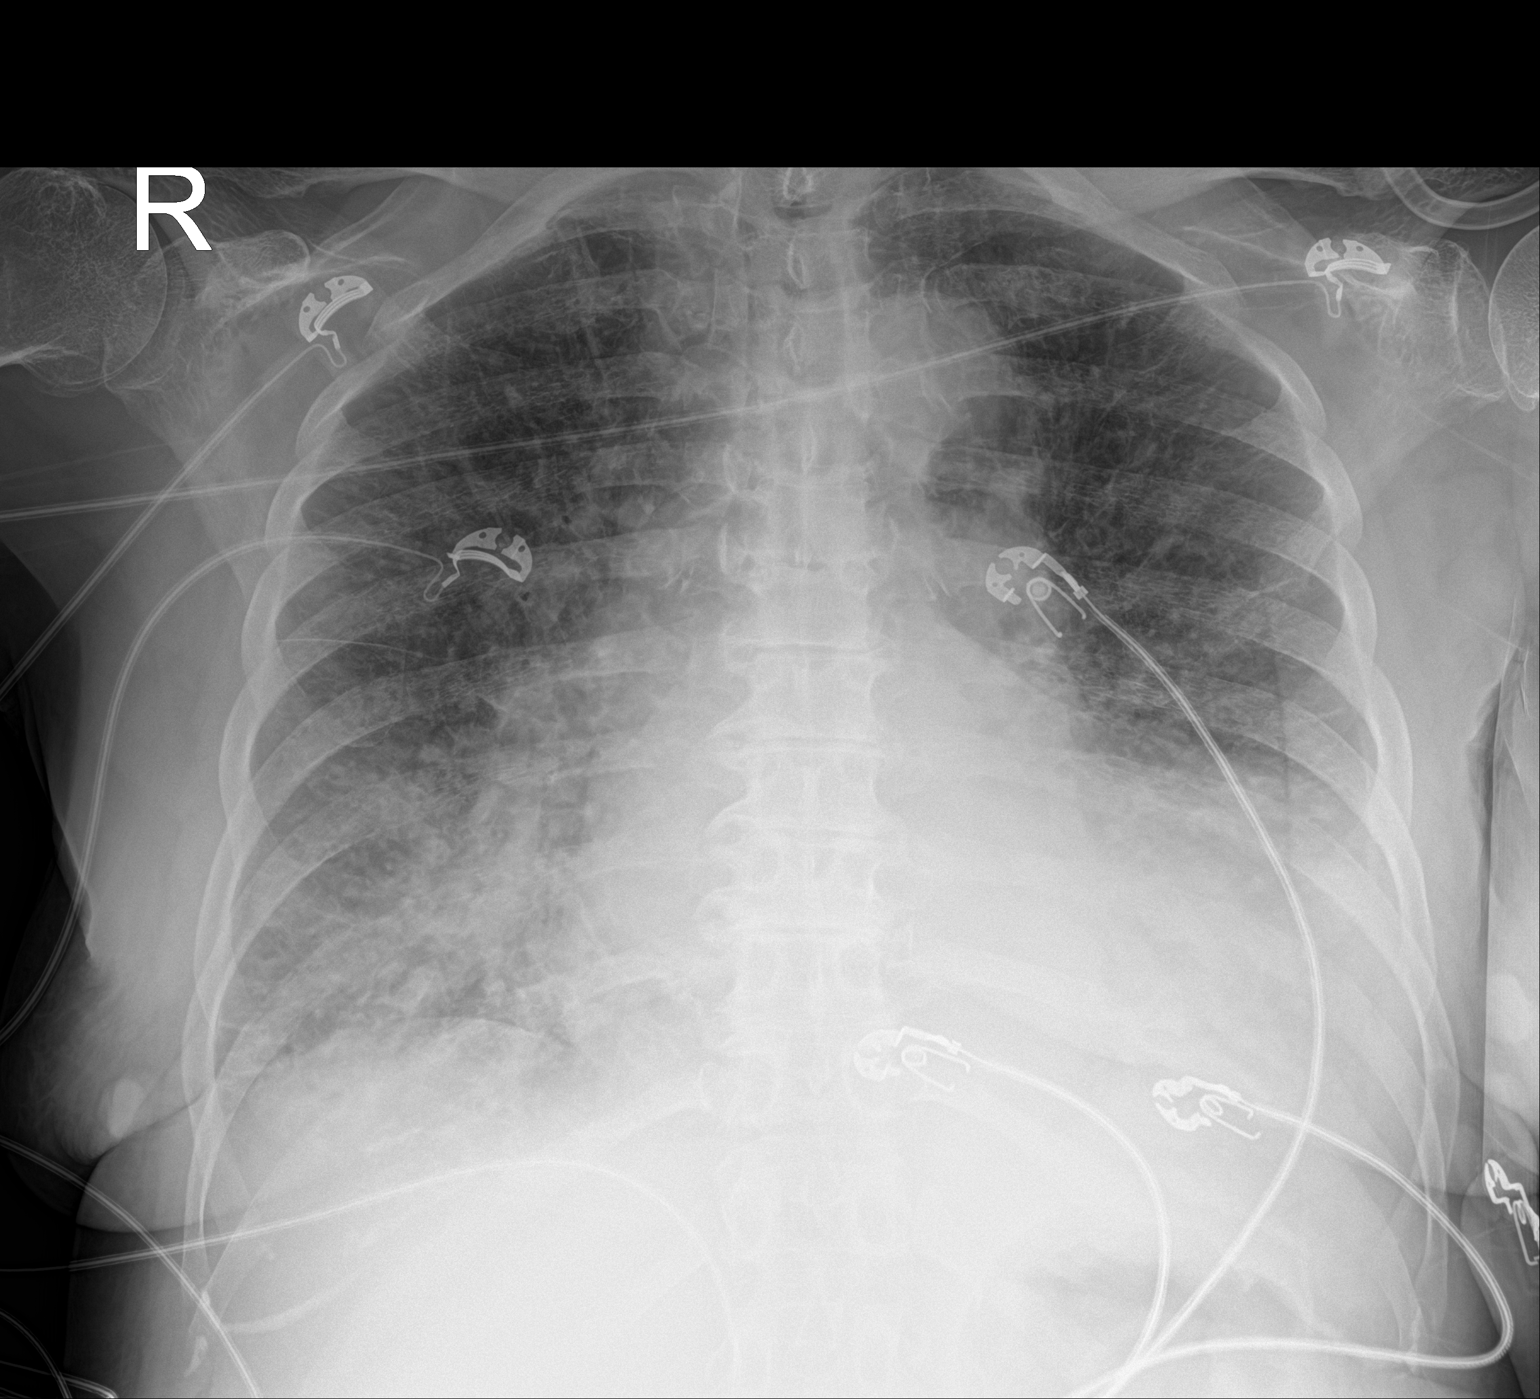

[1 of 1 positions shown; findings below may reference images not displayed]

FINDINGS: The heart is enlarged. Normal mediastinal contours. Moderate
pulmonary edema. Bilateral pleural effusions, left greater than
right. Associated bibasilar opacities likely compressive
atelectasis. No pneumothorax. No acute osseous abnormalities are
seen.
IMPRESSION: Moderate CHF with cardiomegaly, pulmonary edema and bilateral
pleural effusions.
